# Patient Record
Sex: Female | Born: 2000 | Race: White | Hispanic: No | Marital: Single | Smoking: Never smoker
Health system: Southern US, Community
[De-identification: ages and names within clinical notes are randomized; demographics above are authoritative.]

## PROBLEM LIST (undated history)

## (undated) DIAGNOSIS — F32A Depression, unspecified: Secondary | ICD-10-CM

## (undated) DIAGNOSIS — R42 Dizziness and giddiness: Secondary | ICD-10-CM

## (undated) DIAGNOSIS — J302 Other seasonal allergic rhinitis: Secondary | ICD-10-CM

## (undated) DIAGNOSIS — F419 Anxiety disorder, unspecified: Secondary | ICD-10-CM

## (undated) DIAGNOSIS — F603 Borderline personality disorder: Secondary | ICD-10-CM

## (undated) DIAGNOSIS — L309 Dermatitis, unspecified: Secondary | ICD-10-CM

## (undated) DIAGNOSIS — E282 Polycystic ovarian syndrome: Secondary | ICD-10-CM

## (undated) DIAGNOSIS — J45909 Unspecified asthma, uncomplicated: Secondary | ICD-10-CM

## (undated) DIAGNOSIS — F4310 Post-traumatic stress disorder, unspecified: Secondary | ICD-10-CM

## (undated) DIAGNOSIS — F329 Major depressive disorder, single episode, unspecified: Secondary | ICD-10-CM

## (undated) HISTORY — DX: Post-traumatic stress disorder, unspecified: F43.10

## (undated) HISTORY — PX: DENTAL SURGERY: SHX609

## (undated) HISTORY — DX: Dermatitis, unspecified: L30.9

## (undated) HISTORY — DX: Major depressive disorder, single episode, unspecified: F32.9

## (undated) HISTORY — DX: Depression, unspecified: F32.A

## (undated) HISTORY — PX: PILONIDAL CYST EXCISION: SHX744

## (undated) HISTORY — DX: Anxiety disorder, unspecified: F41.9

## (undated) HISTORY — PX: WISDOM TOOTH EXTRACTION: SHX21

---
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
## 2022-02-25 ENCOUNTER — Ambulatory Visit (HOSPITAL_COMMUNITY): Admission: EM | Admit: 2022-02-25 | Discharge: 2022-02-25 | Payer: Medicaid Other

## 2022-02-25 ENCOUNTER — Encounter (HOSPITAL_COMMUNITY): Payer: Self-pay | Admitting: Emergency Medicine

## 2022-02-25 ENCOUNTER — Inpatient Hospital Stay (HOSPITAL_COMMUNITY): Payer: Medicaid Other

## 2022-02-25 ENCOUNTER — Encounter (HOSPITAL_COMMUNITY): Payer: Self-pay

## 2022-02-25 ENCOUNTER — Inpatient Hospital Stay (HOSPITAL_COMMUNITY)
Admission: AD | Admit: 2022-02-25 | Discharge: 2022-02-25 | Disposition: A | Discharge status: Home / Self Care | Payer: Medicaid Other | Attending: Obstetrics and Gynecology | Admitting: Obstetrics and Gynecology

## 2022-02-25 ENCOUNTER — Emergency Department (HOSPITAL_COMMUNITY): Payer: Medicaid Other

## 2022-02-25 DIAGNOSIS — Z3A01 Less than 8 weeks gestation of pregnancy: Secondary | ICD-10-CM | POA: Diagnosis not present

## 2022-02-25 DIAGNOSIS — B999 Unspecified infectious disease: Secondary | ICD-10-CM | POA: Insufficient documentation

## 2022-02-25 DIAGNOSIS — E860 Dehydration: Secondary | ICD-10-CM

## 2022-02-25 DIAGNOSIS — R55 Syncope and collapse: Secondary | ICD-10-CM | POA: Diagnosis not present

## 2022-02-25 DIAGNOSIS — O219 Vomiting of pregnancy, unspecified: Secondary | ICD-10-CM | POA: Diagnosis not present

## 2022-02-25 DIAGNOSIS — O2341 Unspecified infection of urinary tract in pregnancy, first trimester: Secondary | ICD-10-CM

## 2022-02-25 DIAGNOSIS — R1030 Lower abdominal pain, unspecified: Secondary | ICD-10-CM | POA: Insufficient documentation

## 2022-02-25 DIAGNOSIS — R112 Nausea with vomiting, unspecified: Secondary | ICD-10-CM | POA: Diagnosis not present

## 2022-02-25 DIAGNOSIS — E282 Polycystic ovarian syndrome: Secondary | ICD-10-CM | POA: Diagnosis not present

## 2022-02-25 DIAGNOSIS — O26899 Other specified pregnancy related conditions, unspecified trimester: Secondary | ICD-10-CM

## 2022-02-25 DIAGNOSIS — O26891 Other specified pregnancy related conditions, first trimester: Secondary | ICD-10-CM | POA: Diagnosis not present

## 2022-02-25 DIAGNOSIS — O99281 Endocrine, nutritional and metabolic diseases complicating pregnancy, first trimester: Secondary | ICD-10-CM | POA: Diagnosis not present

## 2022-02-25 DIAGNOSIS — Z3490 Encounter for supervision of normal pregnancy, unspecified, unspecified trimester: Secondary | ICD-10-CM

## 2022-02-25 LAB — URINALYSIS, ROUTINE W REFLEX MICROSCOPIC
Bilirubin Urine: NEGATIVE
Glucose, UA: NEGATIVE mg/dL
Hgb urine dipstick: NEGATIVE
Ketones, ur: 80 mg/dL — AB
Nitrite: POSITIVE — AB
Protein, ur: 30 mg/dL — AB
Specific Gravity, Urine: 1.026 (ref 1.005–1.030)
pH: 5 (ref 5.0–8.0)

## 2022-02-25 LAB — WET PREP, GENITAL
Clue Cells Wet Prep HPF POC: NONE SEEN
Sperm: NONE SEEN
Trich, Wet Prep: NONE SEEN
WBC, Wet Prep HPF POC: >=10 — AB (ref <10)
Yeast Wet Prep HPF POC: NONE SEEN

## 2022-02-25 LAB — CBC
HCT: 44.2 % (ref 36.0–46.0)
Hemoglobin: 15 g/dL (ref 12.0–15.0)
MCH: 30.9 pg (ref 26.0–34.0)
MCHC: 33.9 g/dL (ref 30.0–36.0)
MCV: 91.1 fL (ref 80.0–100.0)
Platelets: 343 10*3/uL (ref 150–400)
RBC: 4.85 MIL/uL (ref 3.87–5.11)
RDW: 11.9 % (ref 11.5–15.5)
WBC: 12.2 10*3/uL — ABNORMAL HIGH (ref 4.0–10.5)
nRBC: 0 % (ref 0.0–0.2)

## 2022-02-25 LAB — I-STAT BETA HCG BLOOD, ED (MC, WL, AP ONLY): I-stat hCG, quantitative: >2000 m[IU]/mL — ABNORMAL HIGH (ref <5)

## 2022-02-25 LAB — COMPREHENSIVE METABOLIC PANEL
ALT: 12 U/L (ref 0–44)
AST: 17 U/L (ref 15–41)
Albumin: 4.4 g/dL (ref 3.5–5.0)
Alkaline Phosphatase: 55 U/L (ref 38–126)
Anion gap: 11 (ref 5–15)
BUN: 6 mg/dL (ref 6–20)
CO2: 21 mmol/L — ABNORMAL LOW (ref 22–32)
Calcium: 9.7 mg/dL (ref 8.9–10.3)
Chloride: 104 mmol/L (ref 98–111)
Creatinine, Ser: 0.65 mg/dL (ref 0.44–1.00)
GFR, Estimated: >60 mL/min (ref >60)
Glucose, Bld: 84 mg/dL (ref 70–99)
Potassium: 3.5 mmol/L (ref 3.5–5.1)
Sodium: 136 mmol/L (ref 135–145)
Total Bilirubin: 1.9 mg/dL — ABNORMAL HIGH (ref 0.3–1.2)
Total Protein: 7.2 g/dL (ref 6.5–8.1)

## 2022-02-25 LAB — LIPASE, BLOOD: Lipase: 29 U/L (ref 11–51)

## 2022-02-25 LAB — ABO/RH: ABO/RH(D): O POS

## 2022-02-25 LAB — HCG, QUANTITATIVE, PREGNANCY: hCG, Beta Chain, Quant, S: 29837 m[IU]/mL — ABNORMAL HIGH (ref <5)

## 2022-02-25 MED ORDER — SODIUM CHLORIDE 0.9 % BOLUS PEDS
1000.0000 mL | Freq: Once | INTRAVENOUS | Status: AC
  Administered 2022-02-25: 1000 mL via INTRAVENOUS

## 2022-02-25 MED ORDER — CEFADROXIL 500 MG PO CAPS: 500.0000 mg | ORAL_CAPSULE | Freq: Two times a day (BID) | ORAL | 0 refills | Status: AC

## 2022-02-25 MED ORDER — METOCLOPRAMIDE HCL 10 MG PO TABS: 10.0000 mg | ORAL_TABLET | Freq: Four times a day (QID) | ORAL | 0 refills | Status: DC

## 2022-02-25 MED ORDER — METOCLOPRAMIDE HCL 5 MG/ML IJ SOLN: 10.0000 mg | Freq: Once | INTRAMUSCULAR | Status: DC

## 2022-02-25 MED ORDER — ONDANSETRON HCL 4 MG/2ML IJ SOLN
4.0000 mg | Freq: Once | INTRAMUSCULAR | Status: AC
  Administered 2022-02-25: 4 mg via INTRAVENOUS
  Filled 2022-02-25: qty 2

## 2022-02-25 MED ORDER — METOCLOPRAMIDE HCL 10 MG PO TABS
10.0000 mg | ORAL_TABLET | Freq: Once | ORAL | Status: AC
  Administered 2022-02-25: 10 mg via ORAL
  Filled 2022-02-25: qty 1

## 2022-02-25 MED ORDER — ONDANSETRON 4 MG PO TBDP: 4.0000 mg | ORAL_TABLET | Freq: Once | ORAL | Status: DC

## 2022-02-25 NOTE — ED Provider Triage Note (Signed)
Emergency Medicine Provider Triage Evaluation Note  Judy Ryan , a 21 y.o. female  was evaluated in triage.  Pt complains of nausea and vomiting. Report persistent nausea and vomit x 9 days unable to keep anything down.  No fever, but report lightheadedness and passing out episodes.  Use marijuana on occasion, last use 3 weeks prior.  No abd pain.  Hx of PCOS  Review of Systems  Positive: As above Negative: As above  Physical Exam  BP (!) 128/95 (BP Location: Right Arm)   Pulse 99   Temp 99.3 F (37.4 C) (Oral)   Resp 20   SpO2 100%  Gen:   Awake, no distress   Resp:  Normal effort  MSK:   Moves extremities without difficulty  Other:    Medical Decision Making  Medically screening exam initiated at 5:07 PM.  Appropriate orders placed.  Siboney Fenech was informed that the remainder of the evaluation will be completed by another provider, this initial triage assessment does not replace that evaluation, and the importance of remaining in the ED until their evaluation is complete.     Domenic Moras, PA-C 02/25/22 1710

## 2022-02-25 NOTE — Progress Notes (Signed)
Pt aged out of foster care

## 2022-02-25 NOTE — MAU Note (Signed)
..  Judy Ryan is a 21 y.o. at Unknown here in MAU reporting: lower abdominal pain that feels like pressure and it comes and goes.  N/V has been resolved after treatment in the ED.  LMP: 01/09/2022  Pain score: 5/10 Vitals:   02/25/22 1631 02/25/22 1955  BP: (!) 128/95 111/65  Pulse: 99 96  Resp: 20 20  Temp: 99.3 F (37.4 C) 98.2 F (36.8 C)  SpO2: 100%

## 2022-02-25 NOTE — ED Notes (Signed)
Patient declining ultrasound at this time.

## 2022-02-25 NOTE — ED Triage Notes (Signed)
Patient here with complaint of emesis that started ten days ago and lower abdominal cramping that started last night. Patient also reports multiple syncopal episodes over the last several days that are brought on when she tries to stand. Patient is alert, oriented, and in no apparent distress at this time.

## 2022-02-25 NOTE — MAU Provider Note (Signed)
History     CSN: [id number]  Arrival date and time: 02/25/22 1512   Event Date/Time   First Provider Initiated Contact with Patient 02/25/22 2016      Chief Complaint  Patient presents with   Emesis   HPI Shaya Reddick is a 21 y.o. G1P0 at [redacted]w[redacted]d by LMP who presents to MAU from [facility] for nausea/vomiting and lower abdominal pain. Patient reports nausea and vomiting started 9 days ago and has not been able to keep anything down for the past 5 days. Since she received IV fluids and Zofran in [facility] she is no longer reporting vomiting, but having some nausea. She reports history of vaping and smoking marijuana, however has not used since September. She reports sharp lower abdominal pain that started yesterday. She had some light pink spotting on 10/9 when she wiped, but has not had any since. She denies abdominal discharge, itching, or urinary s/s, but has a slight vaginal odor. LMP was 01/09/2022 however has a history of irregular periods.    OB History     Gravida  1   Para      Term      Preterm      AB  0   Living         SAB  0   IAB      Ectopic      Multiple      Live Births              Past Medical History:  Diagnosis Date   Anxiety    Asthma    Depression    Dizziness    Eczema    PCOS (polycystic ovarian syndrome)    Post traumatic stress disorder (PTSD)    PTSD (post-traumatic stress disorder)    Seasonal allergies     Past Surgical History:  Procedure Laterality Date   DENTAL SURGERY     WISDOM TOOTH EXTRACTION      Family History  Problem Relation Age of Onset   Cancer Paternal Grandmother    Diabetes Maternal Grandmother    Congestive Heart Failure Maternal Grandmother    Cancer Maternal Grandmother    Heart disease Father    Diabetes Father    Depression Mother    Drug abuse Mother    Cancer Mother        ovarian   Emphysema Mother    COPD Mother    Asthma Sister    Depression Sister    Anxiety disorder Sister    Eczema Sister     Diabetes Other        maternal great uncle   Heart disease Maternal Uncle     Social History   Tobacco Use   Smoking status: Never   Smokeless tobacco: Never  Vaping Use   Vaping Use: Former   Quit date: 06/16/2018  Substance Use Topics   Alcohol use: Not Currently   Drug use: Yes    Types: Marijuana    Comment: last use begining of september    Allergies:  Allergies  Allergen Reactions   Coconut Flavor Anaphylaxis   Fish Allergy     Reaction: vomiting per patient; all seafood    Medications Prior to Admission  Medication Sig Dispense Refill Last Dose   albuterol (PROVENTIL) (2.5 MG/3ML) 0.083% nebulizer solution Take 3 mLs (2.5 mg total) by nebulization every 6 (six) hours as needed for wheezing or shortness of breath. 75 mL 12 Past Month   azithromycin (ZITHROMAX) 250  MG tablet Take 1 tablet (250 mg total) by mouth daily. Take first 2 tablets together, then 1 every day until finished. 6 tablet 0    benzonatate (TESSALON) 100 MG capsule Take 1 capsule (100 mg total) by mouth 3 (three) times daily as needed for cough. 30 capsule 0    busPIRone (BUSPAR) 15 MG tablet Take 1 tablet (15 mg total) by mouth 2 (two) times daily. 60 tablet 2    escitalopram (LEXAPRO) 10 MG tablet TAKE 1 TABLET(10 MG) BY MOUTH AT BEDTIME 30 tablet 1    fluticasone-salmeterol (ADVAIR) 500-50 MCG/ACT AEPB Inhale 1 puff into the lungs in the morning and at bedtime. 1 each 5    Fluticasone-Salmeterol (ADVAIR) 500-50 MCG/DOSE AEPB Inhale 1 puff into the lungs 2 (two) times daily. 60 each 0    montelukast (SINGULAIR) 10 MG tablet Take 1 tablet (10 mg total) by mouth at bedtime. 30 tablet 3    risperiDONE (RISPERDAL) 0.5 MG tablet Take 1 tablet (0.5 mg total) by mouth at bedtime. 30 tablet 2    traZODone (DESYREL) 100 MG tablet Take 1 tablet (100 mg total) by mouth at bedtime. 30 tablet 2    Review of Systems  Constitutional: Negative.   Respiratory: Negative.    Cardiovascular: Negative.    Gastrointestinal:  Positive for abdominal pain, constipation, nausea and vomiting.  Genitourinary:  Positive for vaginal bleeding (spotting x1 episode). Negative for dysuria, frequency and vaginal discharge.       Vaginal odor  Musculoskeletal: Negative.   Neurological: Negative.    Physical Exam   Blood pressure 111/65, pulse 96, temperature 98.2 F (36.8 C), temperature source Oral, resp. rate 20, height 5' (1.524 m), last menstrual period 01/09/2022, SpO2 100 %.  Physical Exam Vitals and nursing note reviewed.  Constitutional:      General: Daisa Astorga "[name]" is not in acute distress. Eyes:     Extraocular Movements: Extraocular movements intact.     Pupils: Pupils are equal, round, and reactive to light.  Cardiovascular:     Rate and Rhythm: Normal rate.  Pulmonary:     Effort: Pulmonary effort is normal.  Abdominal:     Palpations: Abdomen is soft.     Tenderness: There is no abdominal tenderness.  Genitourinary:    Comments: Blind swabs collected by RN Musculoskeletal:        General: Normal range of motion.     Cervical back: Normal range of motion.  Skin:    General: Skin is warm and dry.  Neurological:     General: No focal deficit present.     Mental Status: Dottie Oberle "[name]" is alert and oriented to person, place, and time.  Psychiatric:        Mood and Affect: Mood normal.        Behavior: Behavior normal.   US OB Comp < 14 Wks  Result Date: 02/25/2022 CLINICAL DATA:  Lower abdominal pain. EXAM: OBSTETRIC <14 WK [place] AND TRANSVAGINAL OB US TECHNIQUE: Both transabdominal and transvaginal ultrasound examinations were performed for complete evaluation of the gestation as well as the maternal uterus, adnexal regions, and pelvic cul-de-sac. Transvaginal technique was performed to assess early pregnancy. COMPARISON:  05/16/2020. FINDINGS: Intrauterine gestational sac: Single Yolk sac:  Yes Embryo:  Yes Cardiac Activity: Yes Heart Rate: 109 bpm CRL:  6.2 mm   6 w   3 d                   [place] EDC:  10/18/2022 Subchorionic hemorrhage:  None visualized. Maternal uterus/adnexae: Trace amount of free fluid in the pelvis. The right ovary measures 3.7 x 2.1 x 2.3 cm cm. The left ovary measures 3.6 x 1.8 x 1.8 cm cm and a corpus luteal cyst is noted. IMPRESSION: 1. Single live intrauterine pregnancy with estimated gestational age of [redacted] weeks 3 days. EDC: 10/18/2022. 2. Small amount of free fluid in the pelvis. Electronically Signed   By: Brett Fairy M.D.   On: 02/25/2022 21:38   US OB Transvaginal  Result Date: 02/25/2022 CLINICAL DATA:  Lower abdominal pain. EXAM: OBSTETRIC <14 WK [place] AND TRANSVAGINAL OB US TECHNIQUE: Both transabdominal and transvaginal ultrasound examinations were performed for complete evaluation of the gestation as well as the maternal uterus, adnexal regions, and pelvic cul-de-sac. Transvaginal technique was performed to assess early pregnancy. COMPARISON:  05/16/2020. FINDINGS: Intrauterine gestational sac: Single Yolk sac:  Yes Embryo:  Yes Cardiac Activity: Yes Heart Rate: 109 bpm CRL:  6.2 mm   6 w   3 d                  [place] EDC: 10/18/2022 Subchorionic hemorrhage:  None visualized. Maternal uterus/adnexae: Trace amount of free fluid in the pelvis. The right ovary measures 3.7 x 2.1 x 2.3 cm cm. The left ovary measures 3.6 x 1.8 x 1.8 cm cm and a corpus luteal cyst is noted. IMPRESSION: 1. Single live intrauterine pregnancy with estimated gestational age of [redacted] weeks 3 days. EDC: 10/18/2022. 2. Small amount of free fluid in the pelvis. Electronically Signed   By: Brett Fairy M.D.   On: 02/25/2022 21:38    MAU Course  Procedures  MDM HCG, ABO/RH Wet prep, GC/CT [place]  Reviewed labs from [facility]- CBC, CMP, Lipase unremarkable. UA >80 ketones, +nitrites. Will send for urine culture but will treat for UTI tonight.   Blood type O positive. Wet prep negative. GC/CT pending.   Ultrasound shows SIUP with FHR present. Patient moving to [place] this week. She  is to establish [facility] there.   UTI likely cause of abdominal pain. Patient has not had any episodes of vomiting during MAU stay, however reports ongoing nausea so will order Reglan prior to discharge. Reviewed expectations regarding nausea and vomiting in first trimester as well as dietary recommendations in early pregnancy.   Assessment and Plan  [redacted] weeks gestation of pregnancy SIUP Nausea and vomiting in pregnancy prior to [redacted] weeks gestation Abdominal pain affecting pregnancy UTI affecting pregnancy, first trimester  - Discharge home in stable condition - Rx for Duricef and Reglan sent to pharmacy - List of safe medications provided - Establish prenatal care in [place]. Return to MAU as needed for new/worsening symptoms.     Renee Harder, CNM 02/25/2022, 10:10 PM

## 2022-02-25 NOTE — Discharge Instructions (Signed)
Safe Medications in Pregnancy    Acne: Benzoyl Peroxide Salicylic Acid  Backache/Headache: Tylenol: 2 regular strength every 4 hours OR              2 Extra strength every 6 hours  Colds/Coughs/Allergies: Benadryl (alcohol free) 25 mg every 6 hours as needed Breath right strips Claritin Cepacol throat lozenges Chloraseptic throat spray Cold-Eeze- up to three times per day Cough drops, alcohol free Flonase (by prescription only) Guaifenesin Mucinex Robitussin DM (plain only, alcohol free) Saline nasal spray/drops Sudafed (pseudoephedrine) & Actifed ** use only after [redacted] weeks gestation and if you do not have high blood pressure Tylenol Vicks Vaporub Zinc lozenges Zyrtec   Constipation: Colace Ducolax suppositories Fleet enema Glycerin suppositories Metamucil Milk of magnesia Miralax Senokot Smooth move tea  Diarrhea: Kaopectate Imodium A-D  *NO pepto Bismol  Hemorrhoids: Anusol Anusol HC Preparation H Tucks  Indigestion: Tums Maalox Mylanta Zantac  Pepcid  Insomnia: Benadryl (alcohol free) 25mg every 6 hours as needed Tylenol PM Unisom, no Gelcaps  Leg Cramps: Tums MagGel  Nausea/Vomiting:  Bonine Dramamine Emetrol Ginger extract Sea bands Meclizine  Nausea medication to take during pregnancy:  Unisom (doxylamine succinate 25 mg tablets) Take one tablet daily at bedtime. If symptoms are not adequately controlled, the dose can be increased to a maximum recommended dose of two tablets daily (1/2 tablet in the morning, 1/2 tablet mid-afternoon and one at bedtime). Vitamin B6 100mg tablets. Take one tablet twice a day (up to 200 mg per day).  Skin Rashes: Aveeno products Benadryl cream or 25mg every 6 hours as needed Calamine Lotion 1% cortisone cream  Yeast infection: Gyne-lotrimin 7 Monistat 7   **If taking multiple medications, please check labels to avoid duplicating the same active ingredients **take  medication as directed on the label ** Do not exceed 4000 mg of tylenol in 24 hours **Do not take medications that contain aspirin or ibuprofen   [facility] Providers   [facility]             [address], [place], [place] [address] [phone]  [facility]                                                             [address], [address], , [place], [address] [phone]  [facility]                                    [address], [address], [place], [place], [address] [phone]  [facility] [place] [address], [address], [place], [place], [address] [phone]  [facility]                                 [address], [place], [place], [address] [phone]  [facility]                                      [address], [place], [place], [address] [phone]  [facility] [address], [address], [place], [place], [address]                              [facility] [address], [place], [place], [address] [phone]  [facility]         Phone: [phone]  [facility]      Phone: [phone]   [facility]      Phone: [phone]  [facility]         Phone: [phone]   [facility]    Phone: [phone]  [facility]      Phone: [phone]  [facility]     Phone: [phone]  [facility]        Phone: [phone]  [facility]      Phone: [phone]  

## 2022-02-25 NOTE — ED Triage Notes (Signed)
Patient has been vomiting for 9 days. For the last 4 days unable to keep anything down. Unable to drink or eat. Yellow emesis every time she tries to eat.   Patient passed out yesterday and again this morning when trying to stand. No known head injuries.   No new medications or diet changes. Ni travel. No one around the Patient with similar symptoms.

## 2022-02-25 NOTE — ED Provider Notes (Signed)
[facility] EMERGENCY DEPARTMENT Provider Note   CSN: [id number] Arrival date & time: 02/25/22  1512     History Past Medical History:  Diagnosis Date   Anxiety    Asthma    Depression    Dizziness    Eczema    PCOS (polycystic ovarian syndrome)    Post traumatic stress disorder (PTSD)    PTSD (post-traumatic stress disorder)    Seasonal allergies     Chief Complaint  Patient presents with   Emesis    Judy Ryan is a 21 y.o. female.  Emesis X9 days, reports it's yellow/bile. Over the past 4 days she reports inability to tolerate fluids. Reports no urination since yesterday and episodes of syncope yesterday and today. Denies previous constipation but hasn't had a bowel movement in days due to emesis. She is dizzy when sitting up and orthostatic vitals are positive. Does report chills and sweats. Abdominal pain suprapubic, no pain with palpation. Denies any known injury. Pain started intermittent a few days ago and has gradually worsened to constant now.  Denies cough, fever, diarrhea, congestion, or sore throat.  LMP end of August beginning of September. Some light spotting last week. Pt reports hx of PCOS and has irregular periods, currently not on birth control. She is sexually active, denies discharge, pain with sex, dysuria, or GU symptoms. Denies concern of STD.  Used Marijuana around September 14th, no other drug or alcohol use. No daily medications.   The history is provided by the patient. No language interpreter was used.  Emesis Severity:  Severe Duration:  9 days Timing:  Constant Quality:  Bilious material Recent urination:  Absent Relieved by:  Antiemetics Ineffective treatments:  Liquids and ice chips Associated symptoms: abdominal pain and chills   Associated symptoms: no cough, no diarrhea, no fever and no sore throat        Home Medications Prior to Admission medications   Medication Sig Start Date End Date Taking? Authorizing  Provider  albuterol (PROVENTIL) (2.5 MG/3ML) 0.083% nebulizer solution Take 3 mLs (2.5 mg total) by nebulization every 6 (six) hours as needed for wheezing or shortness of breath. 04/23/21   Avegno, Zachery Dakins, FNP  azithromycin (ZITHROMAX) 250 MG tablet Take 1 tablet (250 mg total) by mouth daily. Take first 2 tablets together, then 1 every day until finished. 04/23/21   Avegno, Zachery Dakins, FNP  benzonatate (TESSALON) 100 MG capsule Take 1 capsule (100 mg total) by mouth 3 (three) times daily as needed for cough. 04/23/21   Avegno, Zachery Dakins, FNP  busPIRone (BUSPAR) 15 MG tablet Take 1 tablet (15 mg total) by mouth 2 (two) times daily. 02/20/21   Georganna Skeans, MD  escitalopram (LEXAPRO) 10 MG tablet TAKE 1 TABLET(10 MG) BY MOUTH AT BEDTIME 03/31/21   Georganna Skeans, MD  fluticasone-salmeterol (ADVAIR) 500-50 MCG/ACT AEPB Inhale 1 puff into the lungs in the morning and at bedtime. 02/20/21   Georganna Skeans, MD  Fluticasone-Salmeterol (ADVAIR) 500-50 MCG/DOSE AEPB Inhale 1 puff into the lungs 2 (two) times daily. 04/14/19 05/26/28  Dana Allan, MD  montelukast (SINGULAIR) 10 MG tablet Take 1 tablet (10 mg total) by mouth at bedtime. 02/20/21   Georganna Skeans, MD  risperiDONE (RISPERDAL) 0.5 MG tablet Take 1 tablet (0.5 mg total) by mouth at bedtime. 02/20/21   Georganna Skeans, MD  traZODone (DESYREL) 100 MG tablet Take 1 tablet (100 mg total) by mouth at bedtime. 02/20/21   Georganna Skeans, MD  Allergies    Coconut flavor and Fish allergy    Review of Systems   Review of Systems  Constitutional:  Positive for activity change, appetite change and chills. Negative for fever.  HENT:  Negative for sore throat.   Respiratory:  Negative for cough.   Gastrointestinal:  Positive for abdominal pain, nausea and vomiting. Negative for diarrhea.  Genitourinary:  Positive for decreased urine volume. Negative for dyspareunia, dysuria, genital sores, hematuria, vaginal discharge and vaginal pain.  Skin:   Positive for pallor.  Neurological:  Positive for dizziness and syncope.  All other systems reviewed and are negative.   Physical Exam Updated Vital Signs BP (!) 128/95 (BP Location: Right Arm)   Pulse 99   Temp 99.3 F (37.4 C) (Oral)   Resp 20   SpO2 100%  Physical Exam Vitals and nursing note reviewed.  Constitutional:      General: Xanthe Sloniker "[name]" is not in acute distress.    Appearance: Alianna Knowles "[name]" is well-developed.  HENT:     Head: Normocephalic and atraumatic.     Right Ear: Tympanic membrane, ear canal and external ear normal.     Left Ear: Tympanic membrane, ear canal and external ear normal.     Nose: Nose normal.     Mouth/Throat:     Mouth: Mucous membranes are dry.  Eyes:     Conjunctiva/sclera: Conjunctivae normal.  Cardiovascular:     Rate and Rhythm: Normal rate and regular rhythm.     Pulses: Normal pulses.     Heart sounds: Normal heart sounds. No murmur heard. Pulmonary:     Effort: Pulmonary effort is normal. No respiratory distress.     Breath sounds: Normal breath sounds.  Abdominal:     General: Abdomen is flat. There is no distension.     Palpations: Abdomen is soft.     Tenderness: There is no abdominal tenderness.     Comments: No tenderness to palpation but constant suprapubic abdominal pain  Musculoskeletal:        General: No swelling.     Cervical back: Neck supple. No rigidity or tenderness.  Lymphadenopathy:     Cervical: No cervical adenopathy.  Skin:    General: Skin is warm and dry.     Capillary Refill: Capillary refill takes less than 2 seconds.     Coloration: Skin is pale.  Neurological:     Mental Status: Lenae Bastyr "[name]" is alert and oriented to person, place, and time.  Psychiatric:        Mood and Affect: Mood normal.     ED Results / Procedures / Treatments   Labs (all labs ordered are listed, but only abnormal results are displayed) Labs Reviewed  COMPREHENSIVE METABOLIC PANEL - Abnormal; Notable for the  following components:      Result Value   CO2 21 (*)    Total Bilirubin 1.9 (*)    All other components within normal limits  CBC - Abnormal; Notable for the following components:   WBC 12.2 (*)    All other components within normal limits  I-STAT BETA HCG BLOOD, ED ([facility], [facility], [facility] ONLY) - Abnormal; Notable for the following components:   I-stat hCG, quantitative >2,000.0 (*)    All other components within normal limits  LIPASE, BLOOD  URINALYSIS, ROUTINE W REFLEX MICROSCOPIC  RAPID URINE DRUG SCREEN, HOSP PERFORMED  HCG, QUANTITATIVE, PREGNANCY    EKG None  Radiology No results found.  Procedures Procedures    Medications  Ordered in ED Medications  ondansetron (ZOFRAN) injection 4 mg (4 mg Intravenous Given 02/25/22 1747)  0.9% NaCl bolus PEDS (1,000 mLs Intravenous New Bag/Given 02/25/22 1834)    ED Course/ Medical Decision Making/ A&P                           Medical Decision Making This patient presents to the ED for concern of emesis and syncope, this involves an extensive number of treatment options, and is a complaint that carries with it a high risk of complications and morbidity.  The differential diagnosis includes gastritis, gastroenteritis, pregnancy, ectopic pregnancy, appendicitis, ovarian torsion, UTI, PID   Co morbidities that complicate the patient evaluation        None   Imaging Studies ordered:   I ordered imaging studies including transvaginal ultrasound   Medicines ordered and prescription drug management:   I ordered medication including Zofran, normal saline bolus Reevaluation of the patient after these medicines showed that the patient improved I have reviewed the patients home medicines and have made adjustments as needed   Test Considered:        CBC, CMP, lipase, UA, urine drug screen, i-STAT beta-hCG, quantitative beta-hCG serum, EKG  Cardiac Monitoring:        The patient was maintained on a cardiac monitor.  I personally viewed  and interpreted the cardiac monitored which showed an underlying rhythm of: Sinus   Critical Interventions:        Rule out intracranial process with head CT and consult CPS for child protective   Consultations Obtained:   I requested consultation with APP with MAU   Problem List / ED Course:        Emesis X9 days, reports it's yellow/bile. Over the past 4 days she reports inability to tolerate fluids. Reports no urination since yesterday and episodes of syncope yesterday and today. Denies previous constipation but hasn't had a bowel movement in days due to emesis. She is dizzy when sitting up and orthostatic vitals are positive. Does report chills and sweats. Abdominal pain suprapubic, no pain with palpation. Denies any known injury. Pain started intermittent a few days ago and has gradually worsened to constant now.  Denies cough, fever, diarrhea, congestion, or sore throat.  LMP end of August beginning of September. Some light spotting last week. Pt reports hx of PCOS and has irregular periods, currently not on birth control. She is sexually active, denies discharge, pain with sex, dysuria, or GU symptoms. Denies concern of STD.  Unlikely that STD, PID is the cause of her abdominal pain and symptoms today.  Even in the lack of dysuria we will evaluate for UTI with a UA. Used Marijuana around September 14th, no other drug or alcohol use. No daily medications.  We will also obtain rapid urine drug screen.  On my assessment patient is pale with delayed perfusion.  Her lungs are clear and equal bilaterally, no respiratory distress or cough.  Upon sitting up she has dizziness and feels that she might pass out.  Her abdomen is soft and she has no point tenderness.  Her pain is suprapubic.  The lack of rebound tenderness as well as the patient being afebrile gives any minimal concern for appendicitis.  Abdominal pain started after emesis and is midline with no point tenderness, unlikely ovarian  torsion.  CBC shows mild elevation of white blood cell otherwise is unremarkable.  I-STAT hCG positive for pregnancy.  Patient's last  menstrual period was at the end of August beginning of September.  She did have some spotting on October 9.  I am still concerned about ectopic pregnancy.  OB ultrasound ordered and pending Zofran administered and no further emesis experienced. I discussed the patient with MAU and she is appropriate for transfer for evaluation by labor and delivery/OB.  She is stable and in no acute distress.  She has improved with a normal saline bolus that has been administered in the Zofran.  Her EKG is normal and unlikely that syncope was cardiac in nature and most likely syncope is related to dehydration secondary to the emesis.  Patient is agreeable to plan   Reevaluation:   After the interventions noted above, patient improved and stable   Social Determinants of Health:        Patient is a minor child.     Dispostion:   Transfer to MAU for evaluation  Amount and/or Complexity of Data Reviewed Labs: ordered. Decision-making details documented in ED Course.    Details: Reviewed by me           Final Clinical Impression(s) / ED Diagnoses Final diagnoses:  Pregnancy, unspecified gestational age  Nausea and vomiting, unspecified vomiting type  Dehydration    Rx / DC Orders ED Discharge Orders     None         Ned Clines, NP 02/25/22 1942    Blane Ohara, MD 02/26/22 0002

## 2022-02-25 NOTE — ED Notes (Signed)
Patient is being discharged from the Urgent Care and sent to the Emergency Department via personal vehicle. Per [employer] PA, patient is in need of higher level of care due to LOC and emesis. Patient is aware and verbalizes understanding of plan of care.  Vitals:   02/25/22 1458  BP: (!) 122/98  Pulse: (!) 103  Resp: 16  Temp: 98.5 F (36.9 C)  SpO2: 99%

## 2022-02-25 NOTE — ED Provider Notes (Signed)
Patient here today for evaluation of 9 day history of vomiting which has now developed into syncopal episodes. She reports she has not been able to keep anything down (food or drink) for 4 days. I recommended further evaluation in the ED as I suspect she will need stat labs, IV fluids and further work up given symptoms. Patient is agreeable with plan and has someone to transport her to ED via POV.    Francene Finders, PA-C 02/25/22 1505

## 2022-02-26 LAB — GC/CHLAMYDIA PROBE AMP (~~LOC~~) NOT AT ARMC
Chlamydia: NEGATIVE
Comment: NEGATIVE
Comment: NORMAL
Neisseria Gonorrhea: NEGATIVE

## 2022-02-28 LAB — CULTURE, OB URINE: Culture: >=100000 — AB

## 2022-03-04 ENCOUNTER — Encounter (HOSPITAL_COMMUNITY): Payer: Self-pay | Admitting: Family Medicine

## 2022-03-04 ENCOUNTER — Inpatient Hospital Stay (HOSPITAL_COMMUNITY)
Admission: AD | Admit: 2022-03-04 | Discharge: 2022-03-04 | Disposition: A | Discharge status: Home / Self Care | Payer: Medicaid Other | Attending: Family Medicine | Admitting: Family Medicine

## 2022-03-04 DIAGNOSIS — Z3490 Encounter for supervision of normal pregnancy, unspecified, unspecified trimester: Secondary | ICD-10-CM

## 2022-03-04 DIAGNOSIS — O2311 Infections of bladder in pregnancy, first trimester: Secondary | ICD-10-CM | POA: Insufficient documentation

## 2022-03-04 DIAGNOSIS — Z3A01 Less than 8 weeks gestation of pregnancy: Secondary | ICD-10-CM | POA: Diagnosis not present

## 2022-03-04 DIAGNOSIS — N3000 Acute cystitis without hematuria: Secondary | ICD-10-CM | POA: Diagnosis not present

## 2022-03-04 DIAGNOSIS — O219 Vomiting of pregnancy, unspecified: Secondary | ICD-10-CM | POA: Insufficient documentation

## 2022-03-04 DIAGNOSIS — Z79899 Other long term (current) drug therapy: Secondary | ICD-10-CM | POA: Diagnosis not present

## 2022-03-04 HISTORY — DX: Borderline personality disorder: F60.3

## 2022-03-04 LAB — URINALYSIS, ROUTINE W REFLEX MICROSCOPIC
Glucose, UA: NEGATIVE mg/dL
Hgb urine dipstick: NEGATIVE
Ketones, ur: >80 mg/dL — AB
Nitrite: NEGATIVE
Protein, ur: 30 mg/dL — AB
Specific Gravity, Urine: >1.03 — ABNORMAL HIGH (ref 1.005–1.030)
pH: 6 (ref 5.0–8.0)

## 2022-03-04 LAB — URINALYSIS, MICROSCOPIC (REFLEX): RBC / HPF: NONE SEEN RBC/hpf (ref 0–5)

## 2022-03-04 MED ORDER — ONDANSETRON HCL 4 MG PO TABS: 4.0000 mg | ORAL_TABLET | Freq: Three times a day (TID) | ORAL | 0 refills | Status: DC | PRN

## 2022-03-04 MED ORDER — ONDANSETRON HCL 4 MG/2ML IJ SOLN
4.0000 mg | Freq: Once | INTRAMUSCULAR | Status: AC
  Administered 2022-03-04: 4 mg via INTRAVENOUS
  Filled 2022-03-04: qty 2

## 2022-03-04 MED ORDER — SODIUM CHLORIDE 0.9 % IV SOLN
2.0000 g | Freq: Once | INTRAVENOUS | Status: AC
  Administered 2022-03-04: 2 g via INTRAVENOUS
  Filled 2022-03-04: qty 20

## 2022-03-04 MED ORDER — SODIUM CHLORIDE 0.9 % BOLUS PEDS
1000.0000 mL | Freq: Once | INTRAVENOUS | Status: AC
  Administered 2022-03-04: 1000 mL via INTRAVENOUS

## 2022-03-04 NOTE — MAU Note (Signed)
.  Judy Ryan is a 21 y.o. at [redacted]w[redacted]d here in MAU reporting: she was at the Ed on Friday for n/v. Told she was pregnant and has a UTI. Gave her fluids and Rx for Reglan and antibiotic for UTI.  Pt still not able to keep anything down. Reglan is not helping throws it up and has not been able to keep antibiotic down either. Reports some abd pain and cramping on the right side that started yesterday. Denies any vag bleeding . Has "normal vag discharge.  LMP: 01/09/22 Onset of complaint: over 1 week Pain score: 7 Vitals:   03/04/22 1200  BP: 114/70  Pulse: 84  Resp: 18  Temp: 98.4 F (36.9 C)     FHT:n/a Lab orders placed from triage:   u/a

## 2022-03-04 NOTE — MAU Provider Note (Signed)
History     CSN: [id number]  Arrival date and time: 03/04/22 1131   Event Date/Time   First Provider Initiated Contact with Patient 03/04/22 1245      Chief Complaint  Patient presents with   Emesis   Judy Ryan is a 21 y.o. G1P0000 at [redacted]w[redacted]d who presents today with cramping, nausea and vomiting. She was seen here for this and had an [place] with IUP noted and uti. She states that she has not been able to keep down antibiotics for UTI. She was given reglan for nausea and vomiting, but states that is not helping. She also feels like she has a pressure/pulling sensation in her lower abdomen.   Emesis  This is a new problem. The current episode started 1 to 4 weeks ago. The problem occurs 5 to 10 times per day. The problem has been unchanged. The emesis has an appearance of stomach contents. Risk factors: pregnancy. Treatments tried: reglan. The treatment provided no relief.    OB History     Gravida  1   Para      Term      Preterm      AB  0   Living         SAB  0   IAB      Ectopic      Multiple      Live Births              Past Medical History:  Diagnosis Date   Anxiety    Asthma    Borderline personality disorder (HCC)    Depression    Dizziness    Eczema    PCOS (polycystic ovarian syndrome)    Post traumatic stress disorder (PTSD)    PTSD (post-traumatic stress disorder)    PTSD (post-traumatic stress disorder)    Seasonal allergies     Past Surgical History:  Procedure Laterality Date   DENTAL SURGERY     PILONIDAL CYST EXCISION     Pilonidal tract removal   WISDOM TOOTH EXTRACTION      Family History  Problem Relation Age of Onset   Depression Mother    Drug abuse Mother    Cancer Mother        ovarian   Emphysema Mother    COPD Mother    Anxiety disorder Father    Depression Father    Drug abuse Father    Heart disease Father    Diabetes Father    Bipolar disorder Father    Asthma Sister    Depression Sister    Anxiety  disorder Sister    Eczema Sister    Heart disease Maternal Uncle    Diabetes Maternal Grandmother    Congestive Heart Failure Maternal Grandmother    Cancer Maternal Grandmother    Cancer Paternal Grandmother    Diabetes Other        maternal great uncle    Social History   Tobacco Use   Smoking status: Never   Smokeless tobacco: Never  Vaping Use   Vaping Use: Former   Quit date: 06/16/2018  Substance Use Topics   Alcohol use: Not Currently   Drug use: Yes    Types: Marijuana    Comment: last use begining of September 2023    Allergies:  Allergies  Allergen Reactions   Coconut Flavor Anaphylaxis   Fish Allergy     Reaction: vomiting per patient; all seafood    Medications Prior to Admission  Medication  Sig Dispense Refill Last Dose   cefadroxil (DURICEF) 500 MG capsule Take 1 capsule (500 mg total) by mouth 2 (two) times daily for 7 days. 14 capsule 0 03/03/2022 at 2130   metoCLOPramide (REGLAN) 10 MG tablet Take 1 tablet (10 mg total) by mouth every 6 (six) hours. 30 tablet 0 03/04/2022 at 0700   Prenatal Vit-Fe Fumarate-FA (MULTIVITAMIN-PRENATAL) 27-0.8 MG TABS tablet Take 1 tablet by mouth daily at 12 noon.   03/02/2022 at 2200   albuterol (PROVENTIL) (2.5 MG/3ML) 0.083% nebulizer solution Take 3 mLs (2.5 mg total) by nebulization every 6 (six) hours as needed for wheezing or shortness of breath. 75 mL 12 More than a month   busPIRone (BUSPAR) 15 MG tablet Take 1 tablet (15 mg total) by mouth 2 (two) times daily. 60 tablet 2    escitalopram (LEXAPRO) 10 MG tablet TAKE 1 TABLET(10 MG) BY MOUTH AT BEDTIME 30 tablet 1    fluticasone-salmeterol (ADVAIR) 500-50 MCG/ACT AEPB Inhale 1 puff into the lungs in the morning and at bedtime. 1 each 5    Fluticasone-Salmeterol (ADVAIR) 500-50 MCG/DOSE AEPB Inhale 1 puff into the lungs 2 (two) times daily. 60 each 0    montelukast (SINGULAIR) 10 MG tablet Take 1 tablet (10 mg total) by mouth at bedtime. 30 tablet 3    risperiDONE  (RISPERDAL) 0.5 MG tablet Take 1 tablet (0.5 mg total) by mouth at bedtime. 30 tablet 2    traZODone (DESYREL) 100 MG tablet Take 1 tablet (100 mg total) by mouth at bedtime. 30 tablet 2     Review of Systems  Gastrointestinal:  Positive for vomiting.  All other systems reviewed and are negative.  Physical Exam   Blood pressure 114/67, pulse 68, temperature 98.4 F (36.9 C), resp. rate 20, height 5' (1.524 m), weight 55.8 kg, last menstrual period 01/09/2022, SpO2 99 %.  Physical Exam Constitutional:      Appearance: Murry Kuriakose "Judy Ryan" is well-developed.  HENT:     Head: Normocephalic.  Eyes:     Pupils: Pupils are equal, round, and reactive to light.  Cardiovascular:     Rate and Rhythm: Normal rate and regular rhythm.     Heart sounds: Normal heart sounds.  Pulmonary:     Effort: Pulmonary effort is normal. No respiratory distress.     Breath sounds: Normal breath sounds.  Abdominal:     Palpations: Abdomen is soft.     Tenderness: There is no abdominal tenderness.  Genitourinary:    Vagina: No bleeding. Vaginal discharge: mucusy.    Comments: External: no lesion Vagina: small amount of white discharge     Musculoskeletal:        General: Normal range of motion.     Cervical back: Normal range of motion and neck supple.  Skin:    General: Skin is warm and dry.  Neurological:     Mental Status: Teigan Gardiner "Judy Ryan" is alert and oriented to person, place, and time.  Psychiatric:        Mood and Affect: Mood normal.        Behavior: Behavior normal.     Results for orders placed or performed during the hospital encounter of 03/04/22 (from the past 24 hour(s))  Urinalysis, Routine w reflex microscopic Urine, Clean Catch     Status: Abnormal   Collection Time: 03/04/22 12:40 PM  Result Value Ref Range   Color, Urine YELLOW YELLOW   APPearance CLEAR CLEAR   Specific Gravity, Urine >1.030 (H) 1.005 -  1.030   pH 6.0 5.0 - 8.0   Glucose, UA NEGATIVE NEGATIVE mg/dL   Hgb  urine dipstick NEGATIVE NEGATIVE   Bilirubin Urine SMALL (A) NEGATIVE   Ketones, ur >80 (A) NEGATIVE mg/dL   Protein, ur 30 (A) NEGATIVE mg/dL   Nitrite NEGATIVE NEGATIVE   Leukocytes,Ua SMALL (A) NEGATIVE  Urinalysis, Microscopic (reflex)     Status: Abnormal   Collection Time: 03/04/22 12:40 PM  Result Value Ref Range   RBC / HPF NONE SEEN 0 - 5 RBC/hpf   WBC, UA 6-10 0 - 5 WBC/hpf   Bacteria, UA RARE (A) NONE SEEN   Squamous Epithelial / LPF 21-50 0 - 5   Mucus PRESENT    Ca Oxalate Crys, UA PRESENT      MAU Course  Procedures  MDM Patient has had 1L fluid bolus, zofran and 2g rocephin (for UTI). She is tolerating PO at this time and reports feeling better.   Assessment and Plan   1. Nausea/vomiting in pregnancy   2. [redacted] weeks gestation of pregnancy   3. Acute cystitis without hematuria   4. Intrauterine pregnancy    DC home in stable condition  Comfort measures reviewed  1st Trimester precautions  RX: zofran 4mg  PRN #20  Return to MAU as needed [facility] prenatal care ASAP    [place], CNM  03/04/22  2:59 PM

## 2022-03-11 ENCOUNTER — Encounter (HOSPITAL_COMMUNITY): Payer: Self-pay | Admitting: Obstetrics and Gynecology

## 2022-03-11 ENCOUNTER — Inpatient Hospital Stay (HOSPITAL_COMMUNITY)
Admission: AD | Admit: 2022-03-11 | Discharge: 2022-03-11 | Disposition: A | Discharge status: Home / Self Care | Payer: Medicaid Other | Attending: Obstetrics and Gynecology | Admitting: Obstetrics and Gynecology

## 2022-03-11 ENCOUNTER — Inpatient Hospital Stay (HOSPITAL_COMMUNITY): Payer: Medicaid Other

## 2022-03-11 DIAGNOSIS — Z6740 Type O blood, Rh positive: Secondary | ICD-10-CM | POA: Insufficient documentation

## 2022-03-11 DIAGNOSIS — Z760 Encounter for issue of repeat prescription: Secondary | ICD-10-CM | POA: Insufficient documentation

## 2022-03-11 DIAGNOSIS — O209 Hemorrhage in early pregnancy, unspecified: Secondary | ICD-10-CM | POA: Diagnosis present

## 2022-03-11 DIAGNOSIS — Z3A08 8 weeks gestation of pregnancy: Secondary | ICD-10-CM | POA: Diagnosis not present

## 2022-03-11 DIAGNOSIS — O99611 Diseases of the digestive system complicating pregnancy, first trimester: Secondary | ICD-10-CM | POA: Diagnosis not present

## 2022-03-11 DIAGNOSIS — K5900 Constipation, unspecified: Secondary | ICD-10-CM | POA: Diagnosis not present

## 2022-03-11 LAB — URINALYSIS, ROUTINE W REFLEX MICROSCOPIC
Bilirubin Urine: NEGATIVE
Glucose, UA: NEGATIVE mg/dL
Ketones, ur: NEGATIVE mg/dL
Nitrite: NEGATIVE
Protein, ur: NEGATIVE mg/dL
Specific Gravity, Urine: 1.02 (ref 1.005–1.030)
pH: 6 (ref 5.0–8.0)

## 2022-03-11 LAB — CBC
HCT: 40.1 % (ref 36.0–46.0)
Hemoglobin: 14.1 g/dL (ref 12.0–15.0)
MCH: 31.4 pg (ref 26.0–34.0)
MCHC: 35.2 g/dL (ref 30.0–36.0)
MCV: 89.3 fL (ref 80.0–100.0)
Platelets: 276 10*3/uL (ref 150–400)
RBC: 4.49 MIL/uL (ref 3.87–5.11)
RDW: 11.8 % (ref 11.5–15.5)
WBC: 8.2 10*3/uL (ref 4.0–10.5)
nRBC: 0 % (ref 0.0–0.2)

## 2022-03-11 LAB — RAPID URINE DRUG SCREEN, HOSP PERFORMED
Amphetamines: NOT DETECTED
Barbiturates: NOT DETECTED
Benzodiazepines: NOT DETECTED
Cocaine: NOT DETECTED
Opiates: NOT DETECTED
Tetrahydrocannabinol: POSITIVE — AB

## 2022-03-11 LAB — TYPE AND SCREEN
ABO/RH(D): O POS
Antibody Screen: NEGATIVE

## 2022-03-11 LAB — HCG, QUANTITATIVE, PREGNANCY: hCG, Beta Chain, Quant, S: 109532 m[IU]/mL — ABNORMAL HIGH (ref <5)

## 2022-03-11 MED ORDER — DOCUSATE SODIUM 100 MG PO CAPS: 100.0000 mg | ORAL_CAPSULE | Freq: Two times a day (BID) | ORAL | 2 refills | Status: AC | PRN

## 2022-03-11 MED ORDER — POLYETHYLENE GLYCOL 3350 17 G PO PACK: 17.0000 g | PACK | Freq: Every day | ORAL | 0 refills | Status: AC

## 2022-03-11 MED ORDER — ONDANSETRON HCL 4 MG PO TABS: 4.0000 mg | ORAL_TABLET | Freq: Three times a day (TID) | ORAL | 3 refills | Status: AC | PRN

## 2022-03-11 NOTE — MAU Provider Note (Signed)
History     CSN: [id number]  Arrival date and time: 03/11/22 1709   Event Date/Time   First Provider Initiated Contact with Patient 03/11/22 1817      Chief Complaint  Patient presents with   Vaginal Bleeding   Judy Ryan is a 21 y.o. G1P0000 at [redacted]w[redacted]d who presents to MAU following an episode of heavy vaginal bleeding. She reports passing two quarter-sized blood clots about twenty minutes prior to her arrival. Patient denies history of bleeding in pregnancy. She denies abdominal pain, fever or recent illness. She is remote from sexual intercourse.  Patient requests assessment for UTI. She was previously diagnosed with a UTI by an outside facility but was unable to take the prescribed antibiotics due to severe recurrent vomiting. She denies urinary complaints including dysuria, flank pain, foul-smelling urine, fever.   OB History     Gravida  1   Para      Term      Preterm      AB  0   Living         SAB  0   IAB      Ectopic      Multiple      Live Births              Past Medical History:  Diagnosis Date   Anxiety    Asthma    Borderline personality disorder ([place])    Depression    Dizziness    Eczema    Encounter for insertion of mirena IUD 04/23/2019   04/23/2019   Encounter for Nexplanon removal 05/20/2020   PCOS (polycystic ovarian syndrome)    Post traumatic stress disorder (PTSD)    PTSD (post-traumatic stress disorder)    PTSD (post-traumatic stress disorder)    Seasonal allergies     Past Surgical History:  Procedure Laterality Date   DENTAL SURGERY     PILONIDAL CYST EXCISION     Pilonidal tract removal   WISDOM TOOTH EXTRACTION      Family History  Problem Relation Age of Onset   Depression Mother    Drug abuse Mother    Cancer Mother        ovarian   Emphysema Mother    COPD Mother    Anxiety disorder Father    Depression Father    Drug abuse Father    Heart disease Father    Diabetes Father    Bipolar disorder  Father    Asthma Sister    Depression Sister    Anxiety disorder Sister    Eczema Sister    Heart disease Maternal Uncle    Diabetes Maternal Grandmother    Congestive Heart Failure Maternal Grandmother    Cancer Maternal Grandmother    Cancer Paternal Grandmother    Diabetes Other        maternal great uncle    Social History   Tobacco Use   Smoking status: Never   Smokeless tobacco: Never  Vaping Use   Vaping Use: Former   Quit date: 06/16/2018  Substance Use Topics   Alcohol use: Not Currently   Drug use: Not Currently    Types: Marijuana    Comment: last use begining of September 2023    Allergies:  Allergies  Allergen Reactions   Coconut Flavor Anaphylaxis   Fish Allergy     Reaction: vomiting per patient; all seafood    Medications Prior to Admission  Medication Sig Dispense Refill Last Dose   ondansetron (  ZOFRAN) 4 MG tablet Take 1 tablet (4 mg total) by mouth every 8 (eight) hours as needed for nausea or vomiting. 20 tablet 0 03/11/2022 at 0500   Prenatal Vit-Fe Fumarate-FA (MULTIVITAMIN-PRENATAL) 27-0.8 MG TABS tablet Take 1 tablet by mouth daily at 12 noon.   03/11/2022   albuterol (PROVENTIL) (2.5 MG/3ML) 0.083% nebulizer solution Take 3 mLs (2.5 mg total) by nebulization every 6 (six) hours as needed for wheezing or shortness of breath. 75 mL 12    busPIRone (BUSPAR) 15 MG tablet Take 1 tablet (15 mg total) by mouth 2 (two) times daily. 60 tablet 2    escitalopram (LEXAPRO) 10 MG tablet TAKE 1 TABLET(10 MG) BY MOUTH AT BEDTIME 30 tablet 1    fluticasone-salmeterol (ADVAIR) 500-50 MCG/ACT AEPB Inhale 1 puff into the lungs in the morning and at bedtime. 1 each 5    Fluticasone-Salmeterol (ADVAIR) 500-50 MCG/DOSE AEPB Inhale 1 puff into the lungs 2 (two) times daily. 60 each 0    metoCLOPramide (REGLAN) 10 MG tablet Take 1 tablet (10 mg total) by mouth every 6 (six) hours. 30 tablet 0    montelukast (SINGULAIR) 10 MG tablet Take 1 tablet (10 mg total) by mouth at  bedtime. 30 tablet 3    risperiDONE (RISPERDAL) 0.5 MG tablet Take 1 tablet (0.5 mg total) by mouth at bedtime. 30 tablet 2    traZODone (DESYREL) 100 MG tablet Take 1 tablet (100 mg total) by mouth at bedtime. 30 tablet 2     Review of Systems  Genitourinary:  Positive for vaginal bleeding.  All other systems reviewed and are negative.  Physical Exam   Blood pressure 134/89, pulse 97, temperature 98.1 F (36.7 C), temperature source Oral, resp. rate 19, height 5' (1.524 m), weight 57.7 kg, last menstrual period 01/09/2022, SpO2 100 %.  Physical Exam Vitals and nursing note reviewed. Exam conducted with a chaperone present.  Constitutional:      General: Judy Mccants "[name]" is in acute distress.     Appearance: Normal appearance. Judy Prins "[name]" is not ill-appearing.     Comments: Patient is tearful throughout MAU encounter  Cardiovascular:     Rate and Rhythm: Normal rate and regular rhythm.     Pulses: Normal pulses.     Heart sounds: Normal heart sounds.  Pulmonary:     Effort: Pulmonary effort is normal.     Breath sounds: Normal breath sounds.  Abdominal:     General: Abdomen is flat.     Tenderness: There is no abdominal tenderness. There is no right CVA tenderness or left CVA tenderness.  Skin:    Capillary Refill: Capillary refill takes less than 2 seconds.  Neurological:     Mental Status: Judy Fabiano "[name]" is alert and oriented to person, place, and time.  Psychiatric:        Mood and Affect: Mood normal.        Behavior: Behavior normal.        Thought Content: Thought content normal.        Judgment: Judgment normal.     MAU Course  Procedures  MDM Orders Placed This Encounter  Procedures   Culture, OB Urine   US OB LESS THAN 14 WEEKS WITH OB TRANSVAGINAL   CBC   hCG, quantitative, pregnancy   Rapid urine drug screen (hospital performed)   Urinalysis, Routine w reflex microscopic Urine, Clean Catch   Type and screen [place]    Results for orders placed or  performed during the hospital encounter of 03/11/22 (from the past 24 hour(s))  CBC     Status: None   Collection Time: 03/11/22  5:40 PM  Result Value Ref Range   WBC 8.2 4.0 - 10.5 K/uL   RBC 4.49 3.87 - 5.11 MIL/uL   Hemoglobin 14.1 12.0 - 15.0 g/dL   HCT 40.1 36.0 - 46.0 %   MCV 89.3 80.0 - 100.0 fL   MCH 31.4 26.0 - 34.0 pg   MCHC 35.2 30.0 - 36.0 g/dL   RDW 11.8 11.5 - 15.5 %   Platelets 276 150 - 400 K/uL   nRBC 0.0 0.0 - 0.2 %  hCG, quantitative, pregnancy     Status: Abnormal   Collection Time: 03/11/22  5:40 PM  Result Value Ref Range   hCG, Beta Chain, Quant, S 109,532 (H) <5 mIU/mL  Type and screen [place]     Status: None   Collection Time: 03/11/22  5:40 PM  Result Value Ref Range   ABO/RH(D) O POS    Antibody Screen NEG    Sample Expiration      03/14/2022,2359 Performed at [facility], [address], [place], [place] [address]   Rapid urine drug screen (hospital performed)     Status: Abnormal   Collection Time: 03/11/22  5:49 PM  Result Value Ref Range   Opiates NONE DETECTED NONE DETECTED   Cocaine NONE DETECTED NONE DETECTED   Benzodiazepines NONE DETECTED NONE DETECTED   Amphetamines NONE DETECTED NONE DETECTED   Tetrahydrocannabinol POSITIVE (A) NONE DETECTED   Barbiturates NONE DETECTED NONE DETECTED  Urinalysis, Routine w reflex microscopic Urine, Clean Catch     Status: Abnormal   Collection Time: 03/11/22  5:49 PM  Result Value Ref Range   Color, Urine YELLOW YELLOW   APPearance HAZY (A) CLEAR   Specific Gravity, Urine 1.020 1.005 - 1.030   pH 6.0 5.0 - 8.0   Glucose, UA NEGATIVE NEGATIVE mg/dL   Hgb urine dipstick MODERATE (A) NEGATIVE   Bilirubin Urine NEGATIVE NEGATIVE   Ketones, ur NEGATIVE NEGATIVE mg/dL   Protein, ur NEGATIVE NEGATIVE mg/dL   Nitrite NEGATIVE NEGATIVE   Leukocytes,Ua SMALL (A) NEGATIVE   RBC / HPF 0-5 0 - 5 RBC/hpf   WBC, UA 6-10 0 - 5 WBC/hpf   Bacteria,  UA FEW (A) NONE SEEN   Squamous Epithelial / LPF 21-50 0 - 5   Mucus PRESENT    US OB LESS THAN 14 WEEKS WITH OB TRANSVAGINAL  Result Date: 03/11/2022 CLINICAL DATA:  Vaginal bleeding. LMP: 01/09/2022 corresponding to an estimated gestational age of [redacted] weeks, 5 days. EXAM: OBSTETRIC <14 WK [place] AND TRANSVAGINAL OB US TECHNIQUE: Both transabdominal and transvaginal ultrasound examinations were performed for complete evaluation of the gestation as well as the maternal uterus, adnexal regions, and pelvic cul-de-sac. Transvaginal technique was performed to assess early pregnancy. COMPARISON:  Pelvic ultrasound dated 02/25/2022. FINDINGS: Intrauterine gestational sac: Single intrauterine gestational sac. Yolk sac:  Seen Embryo:  Present Cardiac Activity: Detected Heart Rate: 174 bpm CRL:  21 mm   8 w   5 d                  [place] EDC: 10/16/2022 Subchorionic hemorrhage:  None visualized. Maternal uterus/adnexae: The maternal ovaries are unremarkable. IMPRESSION: Single live intrauterine pregnancy with an estimated gestational age of [redacted] weeks, 5 days concordant with age based on LMP. Electronically Signed   By: Anner Crete  M.D.   On: 03/11/2022 18:35    Meds ordered this encounter  Medications   polyethylene glycol (MIRALAX) 17 g packet    Sig: Take 17 g by mouth daily.    Dispense:  14 each    Refill:  0    Order Specific Question:   Supervising Provider    Answer:   Lynnda Shields A [id number]   docusate sodium (COLACE) 100 MG capsule    Sig: Take 1 capsule (100 mg total) by mouth 2 (two) times daily as needed.    Dispense:  30 capsule    Refill:  2    Order Specific Question:   Supervising Provider    Answer:   Lynnda Shields A [id number]   ondansetron (ZOFRAN) 4 MG tablet    Sig: Take 1 tablet (4 mg total) by mouth every 8 (eight) hours as needed for nausea or vomiting.    Dispense:  30 tablet    Refill:  3    Order Specific Question:   Supervising Provider    Answer:   Griffin Basil  [id number]   Assessment and Plan  ***  Darlina Rumpf, MSA, MSN, CNM 03/11/2022, 6:29 PM

## 2022-03-11 NOTE — MAU Note (Signed)
...  Judy Ryan is a 21 y.o. at [redacted]w[redacted]d here in MAU reporting: Vaginal bleeding that occurred around 20 minutes ago. She reports she woke up at 1700 and used the restroom and when she wiped she noted dark brown discharge. She reports she wiped again and had two quarter sized" blood clots. She states after this she did not see any more blood. She reports she immediately rushed here. Denies vaginal itching and vaginal odors. Denies vaginal discharge prior to bleeding. Denies recent IC. Has not had a bowel movement in 10 days. Reports she takes Zofran daily.  Patient very nervous and tearful during triage process.  Onset of complaint: 1700 Pain score: Denies pain.  Vitals:   03/11/22 1725  BP: 134/89  Pulse: 97  Resp: 19  Temp: 98.1 F (36.7 C)  SpO2: 100%

## 2022-03-13 LAB — CULTURE, OB URINE

## 2023-02-14 ENCOUNTER — Ambulatory Visit (HOSPITAL_COMMUNITY): Payer: Medicaid Other

## 2023-02-14 ENCOUNTER — Ambulatory Visit (HOSPITAL_COMMUNITY)
Admission: EM | Admit: 2023-02-14 | Discharge: 2023-02-14 | Disposition: A | Discharge status: Home / Self Care | Payer: Medicaid Other | Attending: Family Medicine | Admitting: Family Medicine

## 2023-02-14 ENCOUNTER — Encounter (HOSPITAL_COMMUNITY): Payer: Self-pay | Admitting: Emergency Medicine

## 2023-02-14 DIAGNOSIS — M79672 Pain in left foot: Secondary | ICD-10-CM | POA: Diagnosis not present

## 2023-02-14 MED ORDER — KETOROLAC TROMETHAMINE 10 MG PO TABS: 10.0000 mg | ORAL_TABLET | Freq: Four times a day (QID) | ORAL | 0 refills | Status: DC | PRN

## 2023-02-14 MED ORDER — KETOROLAC TROMETHAMINE 30 MG/ML IJ SOLN
30.0000 mg | Freq: Once | INTRAMUSCULAR | Status: AC
  Administered 2023-02-14: 30 mg via INTRAMUSCULAR

## 2023-02-14 MED ORDER — KETOROLAC TROMETHAMINE 30 MG/ML IJ SOLN
INTRAMUSCULAR | Status: AC
  Filled 2023-02-14: qty 1

## 2023-02-14 NOTE — Discharge Instructions (Signed)
By my review, there are no broken bones on your x-ray today.  The radiologist will also read your x-ray, and if their interpretation differs significantly from mine, we will call you.  You have been given a shot of Toradol 30 mg today.  Ketorolac 10 mg tablets--take 1 tablet every 6 hours as needed for pain.  This is the same medicine that is in the shot we just gave you

## 2023-02-14 NOTE — ED Provider Notes (Signed)
[facility]    CSN: [id number] Arrival date & time: 02/14/23  1618      History   Chief Complaint Chief Complaint  Patient presents with   Foot Injury    HPI Judy Ryan is a 22 y.o. female.    Foot Injury Here for pain in her left foot.  On September 28 she was about to go up the ladder into her attic when the top of the ladder came off of its attachment at the ceiling and fell on her left foot.  Is been hurting her a lot when she is walking.  She has been wearing an old boot because she had a previous fracture in that same foot 2 years ago.  The the swelling at least has improved in the last couple of days.  The boot does not fit well as she waited a good bit more when she had that boot prescribed.  Tylenol and ibuprofen are not helping  Past Medical History:  Diagnosis Date   Anxiety    Asthma    Borderline personality disorder (HCC)    Depression    Dizziness    Eczema    Encounter for insertion of Mirena IUD 04/23/2019   04/23/2019   Encounter for Nexplanon removal 05/20/2020   PCOS (polycystic ovarian syndrome)    Post traumatic stress disorder (PTSD)    PTSD (post-traumatic stress disorder)    PTSD (post-traumatic stress disorder)    Seasonal allergies     Patient Active Problem List   Diagnosis Date Noted   Insomnia 02/21/2021   Anxiety and depression 02/21/2021   Moderate persistent asthma without complication 02/21/2021   Encounter for Nexplanon removal 05/20/2020   PCO (polycystic ovaries) 04/27/2020   Pelvic pain 04/27/2020   Abnormal bleeding in menstrual cycle 04/27/2020   Pregnancy examination or test, negative result 04/27/2020   Encounter for initial prescription of vaginal ring hormonal contraceptive 04/27/2020   Self-injurious behavior 05/27/2019   MDD (major depressive disorder) 05/27/2019   MDD (major depressive disorder), recurrent episode, severe (HCC) 05/27/2019   Encounter for insertion of Mirena IUD 04/23/2019   PCOS  (polycystic ovarian syndrome) 04/16/2019   Overdose 04/15/2018   PTSD (post-traumatic stress disorder) 04/27/2016   MDD (major depressive disorder), recurrent severe, without psychosis (HCC) 04/26/2016    Past Surgical History:  Procedure Laterality Date   DENTAL SURGERY     PILONIDAL CYST EXCISION     Pilonidal tract removal   WISDOM TOOTH EXTRACTION      OB History     Gravida  1   Para      Term      Preterm      AB  0   Living         SAB  0   IAB      Ectopic      Multiple      Live Births               Home Medications    Prior to Admission medications   Medication Sig Start Date End Date Taking? Authorizing Provider  ketorolac (TORADOL) 10 MG tablet Take 1 tablet (10 mg total) by mouth every 6 (six) hours as needed (pain). 02/14/23  Yes Zenia Resides, MD  albuterol (PROVENTIL) (2.5 MG/3ML) 0.083% nebulizer solution Take 3 mLs (2.5 mg total) by nebulization every 6 (six) hours as needed for wheezing or shortness of breath. Patient not taking: Reported on 02/14/2023 04/23/21   Nena Jordan  S, FNP  docusate sodium (COLACE) 100 MG capsule Take 1 capsule (100 mg total) by mouth 2 (two) times daily as needed. Patient not taking: Reported on 02/14/2023 03/11/22   Calvert Cantor, CNM  montelukast (SINGULAIR) 10 MG tablet Take 1 tablet (10 mg total) by mouth at bedtime. 02/20/21   Georganna Skeans, MD  ondansetron (ZOFRAN) 4 MG tablet Take 1 tablet (4 mg total) by mouth every 8 (eight) hours as needed for nausea or vomiting. Patient not taking: Reported on 02/14/2023 03/11/22   Calvert Cantor, CNM  polyethylene glycol (MIRALAX) 17 g packet Take 17 g by mouth daily. Patient not taking: Reported on 02/14/2023 03/11/22   Calvert Cantor, CNM  Prenatal Vit-Fe Fumarate-FA (MULTIVITAMIN-PRENATAL) 27-0.8 MG TABS tablet Take 1 tablet by mouth daily at 12 noon.    [provider]    Family History Family History  Problem Relation Age  of Onset   Depression Mother    Drug abuse Mother    Cancer Mother        ovarian   Emphysema Mother    COPD Mother    Anxiety disorder Father    Depression Father    Drug abuse Father    Heart disease Father    Diabetes Father    Bipolar disorder Father    Asthma Sister    Depression Sister    Anxiety disorder Sister    Eczema Sister    Heart disease Maternal Uncle    Diabetes Maternal Grandmother    Congestive Heart Failure Maternal Grandmother    Cancer Maternal Grandmother    Cancer Paternal Grandmother    Diabetes Other        maternal great uncle    Social History Social History   Tobacco Use   Smoking status: Never   Smokeless tobacco: Never  Vaping Use   Vaping status: Former   Quit date: 06/16/2018  Substance Use Topics   Alcohol use: Not Currently   Drug use: Not Currently    Types: Marijuana    Comment: last use begining of September 2023     Allergies   Coconut flavor and Fish allergy   Review of Systems Review of Systems   Physical Exam Triage Vital Signs ED Triage Vitals  Encounter Vitals Group     BP 02/14/23 1633 109/76     Systolic BP Percentile --      Diastolic BP Percentile --      Pulse Rate 02/14/23 1633 76     Resp 02/14/23 1633 16     Temp 02/14/23 1633 98.2 F (36.8 C)     Temp Source 02/14/23 1633 Oral     SpO2 02/14/23 1633 97 %     Weight --      Height --      Head Circumference --      Peak Flow --      Pain Score 02/14/23 1631 7     Pain Loc --      Pain Education --      Exclude from Growth Chart --    No data found.  Updated Vital Signs BP 109/76 (BP Location: Left Arm)   Pulse 76   Temp 98.2 F (36.8 C) (Oral)   Resp 16   SpO2 97%   Breastfeeding No   Visual Acuity Right Eye Distance:   Left Eye Distance:   Bilateral Distance:    Right Eye Near:   Left Eye Near:    Bilateral  Near:     Physical Exam Vitals reviewed.  Constitutional:      General: [name] is not in acute distress.    Appearance:  [name] is not ill-appearing, toxic-appearing or diaphoretic.  Musculoskeletal:     Comments: There is some swelling and tenderness over the distal dorsum of her left foot.  Skin:    Coloration: Skin is not jaundiced or pale.  Neurological:     General: No focal deficit present.     Mental Status: [name] is alert and oriented to person, place, and time.  Psychiatric:        Behavior: Behavior normal.      UC Treatments / Results  Labs (all labs ordered are listed, but only abnormal results are displayed) Labs Reviewed - No data to display  EKG   Radiology DG Foot Complete Left  Result Date: 02/14/2023 CLINICAL DATA:  Foot pain after injury. Patient reports prior foot fracture. EXAM: LEFT FOOT - COMPLETE 3+ VIEW COMPARISON:  Radiograph 01/24/2018 FINDINGS: There is no evidence of fracture or dislocation. 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 No erosions. Alignment and joint spaces are normal. Soft tissues are unremarkable. IMPRESSION: No acute fracture or dislocation of the left foot. Electronically Signed   By: Narda Rutherford M.D.   On: 02/14/2023 17:16    Procedures Procedures (including critical care time)  Medications Ordered in UC Medications  ketorolac (TORADOL) 30 MG/ML injection 30 mg (has no administration in time range)    Initial Impression / Assessment and Plan / UC Course  I have reviewed the triage vital signs and the nursing notes.  Pertinent labs & imaging results that were available during my care of the patient were reviewed by me and considered in my medical decision making (see chart for details).       By my review there is no fracture on x-rays today.  Still think a boot would be beneficial so a new one is prescribed that will fit her better.  Since Tylenol and ibuprofen are not helping, Toradol injection is given here and Toradol tablets are sent into the pharmacy.  she states that her last vaginal bleeding/menses  was right after her baby was born  4 months ago.  She is recently done a home pregnancy test that was negative Final Clinical Impressions(s) / UC Diagnoses   Final diagnoses:  Left foot pain     Discharge Instructions      By my review, there are no broken bones on your x-ray today.  The radiologist will also read your x-ray, and if their interpretation differs significantly from mine, we will call you.  You have been given a shot of Toradol 30 mg today.  Ketorolac 10 mg tablets--take 1 tablet every 6 hours as needed for pain.  This is the same medicine that is in the shot we just gave you       ED Prescriptions     Medication Sig Dispense Auth. Provider   ketorolac (TORADOL) 10 MG tablet Take 1 tablet (10 mg total) by mouth every 6 (six) hours as needed (pain). 20 tablet Alura Olveda, Janace Aris, MD      PDMP not reviewed this encounter.   Zenia Resides, MD 02/14/23 [phone]

## 2023-02-14 NOTE — ED Triage Notes (Signed)
Pt was going into attic 4 days ago and had ladder fall and land on left foot. Pt c/o severe pain in foot when walking. She has had previous fracture in same foot 2 years ago.

## 2023-03-31 ENCOUNTER — Emergency Department (HOSPITAL_COMMUNITY)
Admission: EM | Admit: 2023-03-31 | Discharge: 2023-03-31 | Disposition: A | Discharge status: Home / Self Care | Payer: Medicaid Other | Attending: Emergency Medicine | Admitting: Emergency Medicine

## 2023-03-31 ENCOUNTER — Encounter (HOSPITAL_COMMUNITY): Payer: Self-pay

## 2023-03-31 ENCOUNTER — Emergency Department (HOSPITAL_COMMUNITY): Payer: Medicaid Other

## 2023-03-31 DIAGNOSIS — R1031 Right lower quadrant pain: Secondary | ICD-10-CM | POA: Insufficient documentation

## 2023-03-31 DIAGNOSIS — R112 Nausea with vomiting, unspecified: Secondary | ICD-10-CM | POA: Insufficient documentation

## 2023-03-31 DIAGNOSIS — R1030 Lower abdominal pain, unspecified: Secondary | ICD-10-CM

## 2023-03-31 LAB — CBC
HCT: 45.6 % (ref 36.0–46.0)
Hemoglobin: 14.9 g/dL (ref 12.0–15.0)
MCH: 28.9 pg (ref 26.0–34.0)
MCHC: 32.7 g/dL (ref 30.0–36.0)
MCV: 88.5 fL (ref 80.0–100.0)
Platelets: 339 10*3/uL (ref 150–400)
RBC: 5.15 MIL/uL — ABNORMAL HIGH (ref 3.87–5.11)
RDW: 13.4 % (ref 11.5–15.5)
WBC: 7.8 10*3/uL (ref 4.0–10.5)
nRBC: 0 % (ref 0.0–0.2)

## 2023-03-31 LAB — URINALYSIS, ROUTINE W REFLEX MICROSCOPIC
Bacteria, UA: NONE SEEN
Bilirubin Urine: NEGATIVE
Glucose, UA: NEGATIVE mg/dL
Ketones, ur: NEGATIVE mg/dL
Nitrite: NEGATIVE
Protein, ur: NEGATIVE mg/dL
Specific Gravity, Urine: 1.019 (ref 1.005–1.030)
pH: 6 (ref 5.0–8.0)

## 2023-03-31 LAB — COMPREHENSIVE METABOLIC PANEL
ALT: 18 U/L (ref 0–44)
AST: 19 U/L (ref 15–41)
Albumin: 4.6 g/dL (ref 3.5–5.0)
Alkaline Phosphatase: 65 U/L (ref 38–126)
Anion gap: 10 (ref 5–15)
BUN: 11 mg/dL (ref 6–20)
CO2: 24 mmol/L (ref 22–32)
Calcium: 9.9 mg/dL (ref 8.9–10.3)
Chloride: 103 mmol/L (ref 98–111)
Creatinine, Ser: 0.78 mg/dL (ref 0.44–1.00)
GFR, Estimated: >60 mL/min (ref >60)
Glucose, Bld: 98 mg/dL (ref 70–99)
Potassium: 4.3 mmol/L (ref 3.5–5.1)
Sodium: 137 mmol/L (ref 135–145)
Total Bilirubin: 0.9 mg/dL (ref <1.2)
Total Protein: 7.9 g/dL (ref 6.5–8.1)

## 2023-03-31 LAB — LIPASE, BLOOD: Lipase: 48 U/L (ref 11–51)

## 2023-03-31 LAB — HCG, SERUM, QUALITATIVE: Preg, Serum: NEGATIVE

## 2023-03-31 MED ORDER — IBUPROFEN 200 MG PO TABS
600.0000 mg | ORAL_TABLET | Freq: Once | ORAL | Status: AC
  Administered 2023-03-31: 600 mg via ORAL
  Filled 2023-03-31: qty 3

## 2023-03-31 MED ORDER — ONDANSETRON HCL 4 MG/2ML IJ SOLN
4.0000 mg | Freq: Once | INTRAMUSCULAR | Status: AC
  Administered 2023-03-31: 4 mg via INTRAVENOUS
  Filled 2023-03-31: qty 2

## 2023-03-31 MED ORDER — OXYCODONE-ACETAMINOPHEN 5-325 MG PO TABS
1.0000 | ORAL_TABLET | Freq: Once | ORAL | Status: AC
  Administered 2023-03-31: 1 via ORAL
  Filled 2023-03-31: qty 1

## 2023-03-31 MED ORDER — MORPHINE SULFATE (PF) 4 MG/ML IV SOLN
4.0000 mg | Freq: Once | INTRAVENOUS | Status: AC
  Administered 2023-03-31: 4 mg via INTRAVENOUS
  Filled 2023-03-31: qty 1

## 2023-03-31 MED ORDER — IBUPROFEN 600 MG PO TABS: 600.0000 mg | ORAL_TABLET | Freq: Four times a day (QID) | ORAL | 0 refills | Status: DC | PRN

## 2023-03-31 MED ORDER — IOHEXOL 300 MG/ML  SOLN
100.0000 mL | Freq: Once | INTRAMUSCULAR | Status: AC | PRN
  Administered 2023-03-31: 100 mL via INTRAVENOUS

## 2023-03-31 NOTE — ED Provider Notes (Signed)
[place] EMERGENCY DEPARTMENT AT [facility] Provider Note   CSN: [id number] Arrival date & time: 03/31/23  1426     History  Chief Complaint  Patient presents with   Abdominal Pain   Emesis    Judy Ryan is a 22 y.o. female.  HPI     22 year old female comes in with chief complaint of abdominal pain and vomiting.  Patient has history of PCOS and she is 6 months postpartum.  She indicates that she started having abdominal pain 2 nights ago.  The pain was initially intermittent, generalized but has migrated to the right lower quadrant and the pain is radiating the right back area.  The pain is intense at its peak and is waxing and waning intensity.  She indicates that she has had associated nausea, sweats.  Patient has no history of complications from her previous PCOS and no abdominal surgical history.  No history of kidney stones.  Patient currently is on her period.  She denies any UTI-like symptoms.  Patient is currently married and in a monogamous relationship.  She does not have any vaginal discharge and has no concerns for STI.  Home Medications Prior to Admission medications   Medication Sig Start Date End Date Taking? Authorizing Provider  ibuprofen (ADVIL) 600 MG tablet Take 1 tablet (600 mg total) by mouth every 6 (six) hours as needed. 03/31/23  Yes Derwood Kaplan, MD  albuterol (PROVENTIL) (2.5 MG/3ML) 0.083% nebulizer solution Take 3 mLs (2.5 mg total) by nebulization every 6 (six) hours as needed for wheezing or shortness of breath. Patient not taking: Reported on 02/14/2023 04/23/21   Durward Parcel, FNP  docusate sodium (COLACE) 100 MG capsule Take 1 capsule (100 mg total) by mouth 2 (two) times daily as needed. Patient not taking: Reported on 02/14/2023 03/11/22   Calvert Cantor, CNM  montelukast (SINGULAIR) 10 MG tablet Take 1 tablet (10 mg total) by mouth at bedtime. 02/20/21   Georganna Skeans, MD  ondansetron (ZOFRAN) 4 MG tablet Take 1  tablet (4 mg total) by mouth every 8 (eight) hours as needed for nausea or vomiting. Patient not taking: Reported on 02/14/2023 03/11/22   Calvert Cantor, CNM  polyethylene glycol (MIRALAX) 17 g packet Take 17 g by mouth daily. Patient not taking: Reported on 02/14/2023 03/11/22   Calvert Cantor, CNM  Prenatal Vit-Fe Fumarate-FA (MULTIVITAMIN-PRENATAL) 27-0.8 MG TABS tablet Take 1 tablet by mouth daily at 12 noon.    [provider]      Allergies    Coconut flavor and Fish allergy    Review of Systems   Review of Systems  All other systems reviewed and are negative.   Physical Exam Updated Vital Signs BP 116/69   Pulse 66   Temp 97.9 F (36.6 C) (Oral)   Resp 17   Ht 5' (1.524 m)   Wt 68.5 kg   LMP 03/27/2023   SpO2 100%   BMI 29.49 kg/m  Physical Exam Vitals and nursing note reviewed.  Constitutional:      Appearance: Judy Ryan is well-developed.  HENT:     Head: Atraumatic.  Cardiovascular:     Rate and Rhythm: Normal rate.  Pulmonary:     Effort: Pulmonary effort is normal.  Abdominal:     Tenderness: There is abdominal tenderness. There is guarding and rebound. Positive signs include Rovsing's sign and McBurney's sign.  Musculoskeletal:     Cervical back: Normal range of motion and neck supple.  Skin:  General: Skin is warm and dry.  Neurological:     Mental Status: Judy Ryan is alert and oriented to person, place, and time.     ED Results / Procedures / Treatments   Labs (all labs ordered are listed, but only abnormal results are displayed) Labs Reviewed  CBC - Abnormal; Notable for the following components:      Result Value   RBC 5.15 (*)    All other components within normal limits  URINALYSIS, ROUTINE W REFLEX MICROSCOPIC - Abnormal; Notable for the following components:   Hgb urine dipstick MODERATE (*)    Leukocytes,Ua TRACE (*)    All other components within normal limits  LIPASE, BLOOD  COMPREHENSIVE METABOLIC PANEL  HCG, SERUM,  QUALITATIVE    EKG None  Radiology CT ABDOMEN PELVIS W CONTRAST  Result Date: 03/31/2023 CLINICAL DATA:  Right lower quadrant abdominal pain. EXAM: CT ABDOMEN AND PELVIS WITH CONTRAST TECHNIQUE: Multidetector CT imaging of the abdomen and pelvis was performed using the standard protocol following bolus administration of intravenous contrast. RADIATION DOSE REDUCTION: This exam was performed according to the departmental dose-optimization program which includes automated exposure control, adjustment of the mA and/or kV according to patient size and/or use of iterative reconstruction technique. CONTRAST:  OMNIPAQUE IOHEXOL 300 MG/ML  SOLN COMPARISON:  CT abdomen pelvis dated 05/28/2018. FINDINGS: Lower chest: The visualized lung bases are clear. No intra-abdominal free air or free fluid. Hepatobiliary: No focal liver abnormality is seen. No gallstones, gallbladder wall thickening, or biliary dilatation. Pancreas: Unremarkable. No pancreatic ductal dilatation or surrounding inflammatory changes. Spleen: Normal in size without focal abnormality. Adrenals/Urinary Tract: The adrenal glands are unremarkable. There is no hydronephrosis on either side. He is the visualized ureters and urinary bladder appear unremarkable. Stomach/Bowel: There is no bowel obstruction or active inflammation. The appendix is normal. Vascular/Lymphatic: The abdominal aorta and IVC unremarkable. No portal venous gas. There is no adenopathy. Reproductive: The uterus and ovaries are grossly unremarkable. No suspicious pelvic mass. Other: None Musculoskeletal: No acute or significant osseous findings. IMPRESSION: No acute intra-abdominal or pelvic pathology. Normal appendix. Electronically Signed   By: Elgie Collard M.D.   On: 03/31/2023 17:49    Procedures Procedures    Medications Ordered in ED Medications  ibuprofen (ADVIL) tablet 600 mg (has no administration in time range)  oxyCODONE-acetaminophen (PERCOCET/ROXICET)  5-325 MG per tablet 1 tablet (has no administration in time range)  morphine (PF) 4 MG/ML injection 4 mg (4 mg Intravenous Given 03/31/23 1556)  ondansetron (ZOFRAN) injection 4 mg (4 mg Intravenous Given 03/31/23 1557)  iohexol (OMNIPAQUE) 300 MG/ML solution 100 mL (100 mLs Intravenous Contrast Given 03/31/23 1724)    ED Course/ Medical Decision Making/ A&P                                 Medical Decision Making Amount and/or Complexity of Data Reviewed Labs: ordered. Radiology: ordered.  Risk OTC drugs. Prescription drug management.   22 year old patient comes in with chief complaint of right sided abdominal pain.  She has history of PCOS.  No abdominal surgical history.  She is 6 months postpartum.  On exam patient has right lower quadrant tenderness with guarding, positive Rovsing sign. However, symptoms have some atypical features such as vaccine rating intensity with this abdominal pain.  Differential diagnosis considered for this patient includes acute appendicitis, ovarian cyst, ruptured ovarian cyst, ovarian torsion, fibroid pain, atypical presentation of cholelithiasis, kidney  stone.  Vital signs are stable and within normal limit.  Initial plan is to get basic labs, CT abdomen and pelvis with contrast.  Patient currently not breast-feeding.  6:52 PM Independently interpreted patient's CT scan.  No evidence of perforation.  I could not visualize the appendix.  Per radiologist, there is no acute appendicitis.  Vital signs stable.  No Ovarian cyst noted either.  The patient appears reasonably screened and/or stabilized for discharge and I doubt any other medical condition or other [facility] requiring further screening, evaluation, or treatment in the ED at this time prior to discharge.   Results from the ER workup discussed with the patient face to face and all questions answered to the best of my ability. The patient is safe for discharge with strict return precautions.  Final  Clinical Impression(s) / ED Diagnoses Final diagnoses:  Lower abdominal pain    Rx / DC Orders ED Discharge Orders          Ordered    ibuprofen (ADVIL) 600 MG tablet  Every 6 hours PRN        03/31/23 1852              Derwood Kaplan, MD 03/31/23 1853

## 2023-03-31 NOTE — Discharge Instructions (Addendum)
We saw you in the ER for abdominal pain. All the results in the ER are normal, labs and imaging. We are not sure what is causing your symptoms. The workup in the ER is not complete, and is limited to screening for life threatening and emergent conditions only, so please see the gynecologist for further assessment.

## 2023-03-31 NOTE — ED Triage Notes (Signed)
Pt c/o RLQ abdominal pain radiating into R lower back/flank and n/v x3 days.  Pain score 5/10 increasing w/ movement.  Pt reports taking OTC medication w/o relief.   Pt reports she is unable to pick up her child d/t pain.

## 2023-05-05 ENCOUNTER — Telehealth (HOSPITAL_COMMUNITY): Payer: Self-pay

## 2023-05-05 ENCOUNTER — Ambulatory Visit (HOSPITAL_COMMUNITY)
Admission: EM | Admit: 2023-05-05 | Discharge: 2023-05-05 | Disposition: A | Discharge status: Home / Self Care | Payer: Medicaid Other | Attending: Emergency Medicine | Admitting: Emergency Medicine

## 2023-05-05 ENCOUNTER — Encounter (HOSPITAL_COMMUNITY): Payer: Self-pay | Admitting: Emergency Medicine

## 2023-05-05 DIAGNOSIS — J111 Influenza due to unidentified influenza virus with other respiratory manifestations: Secondary | ICD-10-CM

## 2023-05-05 LAB — POC COVID19/FLU A&B COMBO
Covid Antigen, POC: NEGATIVE
Influenza A Antigen, POC: POSITIVE — AB
Influenza B Antigen, POC: POSITIVE — AB

## 2023-05-05 MED ORDER — GUAIFENESIN ER 600 MG PO TB12: 600.0000 mg | ORAL_TABLET | Freq: Two times a day (BID) | ORAL | 0 refills | Status: DC

## 2023-05-05 MED ORDER — IBUPROFEN 600 MG PO TABS: 600.0000 mg | ORAL_TABLET | Freq: Four times a day (QID) | ORAL | 0 refills | Status: AC | PRN

## 2023-05-05 MED ORDER — ACETAMINOPHEN 500 MG PO TABS: 500.0000 mg | ORAL_TABLET | Freq: Four times a day (QID) | ORAL | 0 refills | Status: AC | PRN

## 2023-05-05 MED ORDER — ACETAMINOPHEN 500 MG PO TABS: 500.0000 mg | ORAL_TABLET | Freq: Four times a day (QID) | ORAL | 0 refills | Status: DC | PRN

## 2023-05-05 MED ORDER — IBUPROFEN 600 MG PO TABS: 600.0000 mg | ORAL_TABLET | Freq: Four times a day (QID) | ORAL | 0 refills | Status: DC | PRN

## 2023-05-05 MED ORDER — PROMETHAZINE-DM 6.25-15 MG/5ML PO SYRP: 5.0000 mL | ORAL_SOLUTION | Freq: Four times a day (QID) | ORAL | 0 refills | Status: AC | PRN

## 2023-05-05 MED ORDER — PROMETHAZINE-DM 6.25-15 MG/5ML PO SYRP: 5.0000 mL | ORAL_SOLUTION | Freq: Four times a day (QID) | ORAL | 0 refills | Status: DC | PRN

## 2023-05-05 MED ORDER — GUAIFENESIN ER 600 MG PO TB12: 600.0000 mg | ORAL_TABLET | Freq: Two times a day (BID) | ORAL | 0 refills | Status: AC

## 2023-05-05 NOTE — ED Provider Notes (Signed)
[facility]    CSN: [id number] Arrival date & time: 05/05/23  1120      History   Chief Complaint No chief complaint on file.   HPI Judy Ryan is a 22 y.o. female.   Patient reports feeling unwell for the past 5 or 6 weeks.  She has had a dry cough, congestion and sore throat.  Over the past few days she has felt much worse, feels like she got hit by a train.  She has had bodyaches, fever, worsening ear pain, worsening sore throat and congestion.  Lives with multiple people at home.  They are all sick with similar symptoms.  She has been taking Tylenol.  She has done hot baths, had 1 about an hour prior to arrival.  Denies nausea, vomiting or diarrhea.  Reports her ears were bleeding the other night, no bleeding today.  Endorses mucus is green/yellow/orange.  The history is provided by the patient and medical records.    Past Medical History:  Diagnosis Date   Anxiety    Asthma    Borderline personality disorder (HCC)    Depression    Dizziness    Eczema    Encounter for insertion of Mirena IUD 04/23/2019   04/23/2019   Encounter for Nexplanon removal 05/20/2020   PCOS (polycystic ovarian syndrome)    Post traumatic stress disorder (PTSD)    PTSD (post-traumatic stress disorder)    PTSD (post-traumatic stress disorder)    Seasonal allergies     Patient Active Problem List   Diagnosis Date Noted   Insomnia 02/21/2021   Anxiety and depression 02/21/2021   Moderate persistent asthma without complication 02/21/2021   Encounter for Nexplanon removal 05/20/2020   PCO (polycystic ovaries) 04/27/2020   Pelvic pain 04/27/2020   Abnormal bleeding in menstrual cycle 04/27/2020   Pregnancy examination or test, negative result 04/27/2020   Encounter for initial prescription of vaginal ring hormonal contraceptive 04/27/2020   Self-injurious behavior 05/27/2019   MDD (major depressive disorder) 05/27/2019   MDD (major depressive disorder), recurrent episode,  severe (HCC) 05/27/2019   Encounter for insertion of Mirena IUD 04/23/2019   PCOS (polycystic ovarian syndrome) 04/16/2019   Overdose 04/15/2018   PTSD (post-traumatic stress disorder) 04/27/2016   MDD (major depressive disorder), recurrent severe, without psychosis (HCC) 04/26/2016    Past Surgical History:  Procedure Laterality Date   DENTAL SURGERY     PILONIDAL CYST EXCISION     Pilonidal tract removal   WISDOM TOOTH EXTRACTION      OB History     Gravida  1   Para      Term      Preterm      AB  0   Living         SAB  0   IAB      Ectopic      Multiple      Live Births               Home Medications    Prior to Admission medications   Medication Sig Start Date End Date Taking? Authorizing Provider  acetaminophen (TYLENOL) 500 MG tablet Take 1 tablet (500 mg total) by mouth every 6 (six) hours as needed. 05/05/23  Yes Rinaldo Ratel, [place] N, FNP  guaiFENesin (MUCINEX) 600 MG 12 hr tablet Take 1 tablet (600 mg total) by mouth 2 (two) times daily for 5 days. 05/05/23 05/10/23 Yes Rinaldo Ratel, [place] N, FNP  ibuprofen (ADVIL) 600 MG tablet Take 1  tablet (600 mg total) by mouth every 6 (six) hours as needed. 05/05/23  Yes Rinaldo Ratel, [place] N, FNP  promethazine-dextromethorphan (PROMETHAZINE-DM) 6.25-15 MG/5ML syrup Take 5 mLs by mouth 4 (four) times daily as needed for cough. 05/05/23  Yes Rinaldo Ratel, [place] N, FNP  albuterol (PROVENTIL) (2.5 MG/3ML) 0.083% nebulizer solution Take 3 mLs (2.5 mg total) by nebulization every 6 (six) hours as needed for wheezing or shortness of breath. Patient not taking: Reported on 02/14/2023 04/23/21   Durward Parcel, FNP  docusate sodium (COLACE) 100 MG capsule Take 1 capsule (100 mg total) by mouth 2 (two) times daily as needed. Patient not taking: Reported on 02/14/2023 03/11/22   Calvert Cantor, CNM  montelukast (SINGULAIR) 10 MG tablet Take 1 tablet (10 mg total) by mouth at bedtime. 02/20/21   Georganna Skeans, MD   ondansetron (ZOFRAN) 4 MG tablet Take 1 tablet (4 mg total) by mouth every 8 (eight) hours as needed for nausea or vomiting. Patient not taking: Reported on 02/14/2023 03/11/22   Calvert Cantor, CNM  polyethylene glycol (MIRALAX) 17 g packet Take 17 g by mouth daily. Patient not taking: Reported on 02/14/2023 03/11/22   Calvert Cantor, CNM    Family History Family History  Problem Relation Age of Onset   Depression Mother    Drug abuse Mother    Cancer Mother        ovarian   Emphysema Mother    COPD Mother    Anxiety disorder Father    Depression Father    Drug abuse Father    Heart disease Father    Diabetes Father    Bipolar disorder Father    Asthma Sister    Depression Sister    Anxiety disorder Sister    Eczema Sister    Heart disease Maternal Uncle    Diabetes Maternal Grandmother    Congestive Heart Failure Maternal Grandmother    Cancer Maternal Grandmother    Cancer Paternal Grandmother    Diabetes Other        maternal great uncle    Social History Social History   Tobacco Use   Smoking status: Never   Smokeless tobacco: Never  Vaping Use   Vaping status: Some Days   Last attempt to quit: 06/16/2018  Substance Use Topics   Alcohol use: Not Currently   Drug use: Not Currently    Types: Marijuana    Comment: last use begining of September 2023     Allergies   Coconut flavoring agent (non-screening) and Fish allergy   Review of Systems Review of Systems  Per HPI   Physical Exam Triage Vital Signs ED Triage Vitals  Encounter Vitals Group     BP 05/05/23 1225 116/74     Systolic BP Percentile --      Diastolic BP Percentile --      Pulse Rate 05/05/23 1225 74     Resp 05/05/23 1225 16     Temp 05/05/23 1225 98.1 F (36.7 C)     Temp Source 05/05/23 1225 Oral     SpO2 05/05/23 1225 98 %     Weight --      Height --      Head Circumference --      Peak Flow --      Pain Score 05/05/23 1223 7     Pain Loc --      Pain  Education --      Exclude from Growth Chart --  No data found.  Updated Vital Signs BP 116/74 (BP Location: Left Arm)   Pulse 74   Temp 98.1 F (36.7 C) (Oral)   Resp 16   LMP 03/31/2023 (Approximate)   SpO2 98%   Visual Acuity Right Eye Distance:   Left Eye Distance:   Bilateral Distance:    Right Eye Near:   Left Eye Near:    Bilateral Near:     Physical Exam Vitals and nursing note reviewed.  Constitutional:      Appearance: Normal appearance.  HENT:     Head: Normocephalic and atraumatic.     Right Ear: Tympanic membrane, ear canal and external ear normal.     Left Ear: Tympanic membrane, ear canal and external ear normal.     Nose: Congestion and rhinorrhea present.     Mouth/Throat:     Mouth: Mucous membranes are moist.     Pharynx: Posterior oropharyngeal erythema present.  Eyes:     Conjunctiva/sclera: Conjunctivae normal.  Cardiovascular:     Rate and Rhythm: Normal rate and regular rhythm.     Heart sounds: Normal heart sounds. No murmur heard. Pulmonary:     Effort: Pulmonary effort is normal. No respiratory distress.     Breath sounds: Normal breath sounds.  Musculoskeletal:     Cervical back: Normal range of motion.  Lymphadenopathy:     Cervical: Cervical adenopathy present.  Skin:    General: Skin is warm and dry.  Neurological:     General: No focal deficit present.     Mental Status: [name] is alert and oriented to person, place, and time.  Psychiatric:        Mood and Affect: Mood normal.        Behavior: Behavior normal.      UC Treatments / Results  Labs (all labs ordered are listed, but only abnormal results are displayed) Labs Reviewed  POC COVID19/FLU A&B COMBO - Abnormal; Notable for the following components:      Result Value   Influenza A Antigen, POC Positive (*)    Influenza B Antigen, POC Positive (*)    All other components within normal limits    EKG   Radiology No results found.  Procedures Procedures  (including critical care time)  Medications Ordered in UC Medications - No data to display  Initial Impression / Assessment and Plan / UC Course  I have reviewed the triage vital signs and the nursing notes.  Pertinent labs & imaging results that were available during my care of the patient were reviewed by me and considered in my medical decision making (see chart for details).  Vitals and triage reviewed, patient is hemodynamically stable.  Lungs are vesicular, heart with regular rate and rhythm.  Bilateral tympanic membranes with good cone of light, no erythema or bulging.  Recent increase in symptoms with bodyaches, fever and chills, suspect viral etiology.  Influenza testing positive.  Symptomatic management encouraged, patient out of window for Tamiflu.  Plan of care, follow-up care return precautions given, no questions at this time.     Final Clinical Impressions(s) / UC Diagnoses   Final diagnoses:  Influenza     Discharge Instructions      You have flu A and flu B.  This is a viral illness that is managed with symptomatic measures.  You can use 1200 mg of Mucinex daily to help with congestion.  Alternate between and her milligrams of ibuprofen and 500 mg of Tylenol every 4-6 hours  for any fever, body aches or chills.  Your congestion appears to be consistent, you can use over-the-counter Flonase for this daily.  Use the cough medicine as needed, do not drink or drive on this medication as it may cause drowsiness.  Your symptoms should improve over the next 5 to 7 days.  If no improvement or any changes please follow-up with your primary care provider.  Return to clinic for any new or urgent symptoms.      ED Prescriptions     Medication Sig Dispense Auth. Provider   promethazine-dextromethorphan (PROMETHAZINE-DM) 6.25-15 MG/5ML syrup Take 5 mLs by mouth 4 (four) times daily as needed for cough. 118 mL Rinaldo Ratel, [place] N, FNP   guaiFENesin (MUCINEX) 600 MG 12 hr tablet  Take 1 tablet (600 mg total) by mouth 2 (two) times daily for 5 days. 10 tablet Rinaldo Ratel, [place] N, [place]   acetaminophen (TYLENOL) 500 MG tablet Take 1 tablet (500 mg total) by mouth every 6 (six) hours as needed. 30 tablet Rinaldo Ratel, [place] N, [place]   ibuprofen (ADVIL) 600 MG tablet Take 1 tablet (600 mg total) by mouth every 6 (six) hours as needed. 30 tablet Sherma Vanmetre, [place] N, [place]      PDMP not reviewed this encounter.   Babe Anthis, [place] N, [place] 05/05/23 1319

## 2023-05-05 NOTE — Discharge Instructions (Addendum)
You have flu A and flu B.  This is a viral illness that is managed with symptomatic measures.  You can use 1200 mg of Mucinex daily to help with congestion.  Alternate between and her milligrams of ibuprofen and 500 mg of Tylenol every 4-6 hours for any fever, body aches or chills.  Your congestion appears to be consistent, you can use over-the-counter Flonase for this daily.  Use the cough medicine as needed, do not drink or drive on this medication as it may cause drowsiness.  Your symptoms should improve over the next 5 to 7 days.  If no improvement or any changes please follow-up with your primary care provider.  Return to clinic for any new or urgent symptoms.

## 2023-05-05 NOTE — ED Triage Notes (Signed)
Pt reports 5-6 weeks ago went to a funeral. 3-4 days after getting home pt had cold symptoms. Pt reports fevers, headaches, ears are bleeding, nasal congestion and sore throat. Took tylenol every 4 hours and hot steamy baths. Hasn't taken medications for congestion.  Adds she had vomiting as well.

## 2023-06-10 ENCOUNTER — Telehealth: Payer: Self-pay | Admitting: Family Medicine

## 2023-06-10 ENCOUNTER — Ambulatory Visit: Payer: Medicaid Other | Admitting: Family Medicine

## 2023-06-10 NOTE — Telephone Encounter (Signed)
Called patient to reschedule missed appointment no answer couldn't leave voicemail
